# Patient Record
Sex: Female | Born: 1955 | Race: Black or African American | Hispanic: No | Marital: Married | Smoking: Never smoker
Health system: Southern US, Community
[De-identification: ages and names within clinical notes are randomized; demographics above are authoritative.]

## PROBLEM LIST (undated history)

## (undated) DIAGNOSIS — I10 Essential (primary) hypertension: Secondary | ICD-10-CM

## (undated) DIAGNOSIS — K5792 Diverticulitis of intestine, part unspecified, without perforation or abscess without bleeding: Secondary | ICD-10-CM

## (undated) DIAGNOSIS — J302 Other seasonal allergic rhinitis: Secondary | ICD-10-CM

## (undated) HISTORY — PX: LAPAROSCOPIC HYSTERECTOMY: SHX1926

## (undated) HISTORY — PX: ABDOMINAL HYSTERECTOMY: SHX81

## (undated) HISTORY — PX: CHOLECYSTECTOMY: SHX55

---

## 2003-08-27 ENCOUNTER — Other Ambulatory Visit: Admission: RE | Admit: 2003-08-27 | Discharge: 2003-08-27 | Payer: Self-pay | Admitting: Obstetrics and Gynecology

## 2004-07-16 ENCOUNTER — Other Ambulatory Visit: Admission: RE | Admit: 2004-07-16 | Discharge: 2004-07-16 | Payer: Self-pay | Admitting: Obstetrics and Gynecology

## 2004-11-24 ENCOUNTER — Other Ambulatory Visit: Admission: RE | Admit: 2004-11-24 | Discharge: 2004-11-24 | Payer: Self-pay | Admitting: Obstetrics and Gynecology

## 2005-12-06 ENCOUNTER — Other Ambulatory Visit: Admission: RE | Admit: 2005-12-06 | Discharge: 2005-12-06 | Payer: Self-pay | Admitting: Obstetrics and Gynecology

## 2013-07-28 ENCOUNTER — Encounter (HOSPITAL_BASED_OUTPATIENT_CLINIC_OR_DEPARTMENT_OTHER): Payer: Self-pay | Admitting: Emergency Medicine

## 2013-07-28 ENCOUNTER — Emergency Department (HOSPITAL_BASED_OUTPATIENT_CLINIC_OR_DEPARTMENT_OTHER)
Admission: EM | Admit: 2013-07-28 | Discharge: 2013-07-29 | Disposition: A | Discharge status: Home / Self Care | Payer: BC Managed Care – PPO | Attending: Emergency Medicine | Admitting: Emergency Medicine

## 2013-07-28 ENCOUNTER — Emergency Department (HOSPITAL_BASED_OUTPATIENT_CLINIC_OR_DEPARTMENT_OTHER): Payer: BC Managed Care – PPO

## 2013-07-28 DIAGNOSIS — K5792 Diverticulitis of intestine, part unspecified, without perforation or abscess without bleeding: Secondary | ICD-10-CM

## 2013-07-28 DIAGNOSIS — K5732 Diverticulitis of large intestine without perforation or abscess without bleeding: Secondary | ICD-10-CM | POA: Insufficient documentation

## 2013-07-28 DIAGNOSIS — I10 Essential (primary) hypertension: Secondary | ICD-10-CM | POA: Insufficient documentation

## 2013-07-28 HISTORY — DX: Essential (primary) hypertension: I10

## 2013-07-28 LAB — CBC WITH DIFFERENTIAL/PLATELET
BASOS ABS: 0 10*3/uL (ref 0.0–0.1)
Basophils Relative: 0 % (ref 0–1)
EOS PCT: 0 % (ref 0–5)
Eosinophils Absolute: 0 10*3/uL (ref 0.0–0.7)
HCT: 40.1 % (ref 36.0–46.0)
Hemoglobin: 13 g/dL (ref 12.0–15.0)
LYMPHS PCT: 23 % (ref 12–46)
Lymphs Abs: 1.8 10*3/uL (ref 0.7–4.0)
MCH: 27 pg (ref 26.0–34.0)
MCHC: 32.4 g/dL (ref 30.0–36.0)
MCV: 83.4 fL (ref 78.0–100.0)
Monocytes Absolute: 0.5 10*3/uL (ref 0.1–1.0)
Monocytes Relative: 6 % (ref 3–12)
NEUTROS PCT: 70 % (ref 43–77)
Neutro Abs: 5.4 10*3/uL (ref 1.7–7.7)
PLATELETS: 310 10*3/uL (ref 150–400)
RBC: 4.81 MIL/uL (ref 3.87–5.11)
RDW: 15.2 % (ref 11.5–15.5)
WBC: 7.6 10*3/uL (ref 4.0–10.5)

## 2013-07-28 LAB — URINALYSIS, ROUTINE W REFLEX MICROSCOPIC
Bilirubin Urine: NEGATIVE
Glucose, UA: NEGATIVE mg/dL
Hgb urine dipstick: NEGATIVE
Ketones, ur: NEGATIVE mg/dL
LEUKOCYTES UA: NEGATIVE
NITRITE: NEGATIVE
PROTEIN: NEGATIVE mg/dL
Specific Gravity, Urine: 1.008 (ref 1.005–1.030)
UROBILINOGEN UA: 1 mg/dL (ref 0.0–1.0)
pH: 8.5 — ABNORMAL HIGH (ref 5.0–8.0)

## 2013-07-28 LAB — COMPREHENSIVE METABOLIC PANEL
ALK PHOS: 71 U/L (ref 39–117)
ALT: 11 U/L (ref 0–35)
AST: 17 U/L (ref 0–37)
Albumin: 3.9 g/dL (ref 3.5–5.2)
BUN: 10 mg/dL (ref 6–23)
CO2: 26 meq/L (ref 19–32)
Calcium: 9.6 mg/dL (ref 8.4–10.5)
Chloride: 103 mEq/L (ref 96–112)
Creatinine, Ser: 0.8 mg/dL (ref 0.50–1.10)
GFR, EST NON AFRICAN AMERICAN: 80 mL/min — AB (ref >90)
GLUCOSE: 121 mg/dL — AB (ref 70–99)
Potassium: 3.4 mEq/L — ABNORMAL LOW (ref 3.7–5.3)
Sodium: 144 mEq/L (ref 137–147)
TOTAL PROTEIN: 7.6 g/dL (ref 6.0–8.3)
Total Bilirubin: 0.6 mg/dL (ref 0.3–1.2)

## 2013-07-28 LAB — LIPASE, BLOOD: LIPASE: 15 U/L (ref 11–59)

## 2013-07-28 MED ORDER — IOHEXOL 300 MG/ML  SOLN
100.0000 mL | Freq: Once | INTRAMUSCULAR | Status: AC | PRN
  Administered 2013-07-28: 100 mL via INTRAVENOUS

## 2013-07-28 MED ORDER — ONDANSETRON HCL 4 MG/2ML IJ SOLN
4.0000 mg | Freq: Once | INTRAMUSCULAR | Status: AC
  Administered 2013-07-28: 4 mg via INTRAMUSCULAR
  Filled 2013-07-28: qty 2

## 2013-07-28 MED ORDER — IOHEXOL 300 MG/ML  SOLN
50.0000 mL | Freq: Once | INTRAMUSCULAR | Status: AC | PRN
  Administered 2013-07-28: 50 mL via ORAL

## 2013-07-28 NOTE — ED Notes (Signed)
Patient here with ongoing abdominal cramping with loose stools and vomiting after any food intake. Reports that this all started after norovirus in November. Has taken antibiotics for same with no relief

## 2013-07-28 NOTE — ED Provider Notes (Signed)
CSN: [id number]     Arrival date & time 07/28/13  1800 History   None    Chief Complaint  Patient presents with  . Abdominal Pain  . Emesis  . Diarrhea     (Consider location/radiation/quality/duration/timing/severity/associated sxs/prior Treatment) Patient is a 58 y.o. female presenting with abdominal pain. The history is provided by the patient. No language interpreter was used.  Abdominal Pain Pain location:  Generalized Pain quality: aching   Pain radiates to:  Does not radiate Pain severity:  Moderate Onset quality:  Gradual Duration: november. Timing:  Constant Relieved by:  Nothing Associated symptoms: diarrhea, fever and vomiting     Past Medical History  Diagnosis Date  . Hypertension    History reviewed. No pertinent past surgical history. No family history on file. History  Substance Use Topics  . Smoking status: Never Smoker   . Smokeless tobacco: Not on file  . Alcohol Use: Not on file   OB History   Grav Para Term Preterm Abortions TAB SAB Ect Mult Living                 Review of Systems  Constitutional: Positive for fever.  Gastrointestinal: Positive for vomiting, abdominal pain and diarrhea.  All other systems reviewed and are negative.      Allergies  Review of patient's allergies indicates no known allergies.  Home Medications   Current Outpatient Rx  Name  Route  Sig  Dispense  Refill  . diltiazem (CARDIZEM) 120 MG tablet   Oral   Take 120 mg by mouth once.          BP 113/47  Pulse 91  Temp(Src) 98.6 F (37 C) (Oral)  Resp 18  Wt 229 lb (103.874 kg)  SpO2 100% Physical Exam  Nursing note and vitals reviewed. Constitutional: She is oriented to person, place, and time. She appears well-developed and well-nourished.  HENT:  Head: Normocephalic.  Right Ear: External ear normal.  Left Ear: External ear normal.  Mouth/Throat: Oropharynx is clear and moist.  Eyes: Conjunctivae and EOM are normal. Pupils are equal, round,  and reactive to light.  Neck: Normal range of motion.  Pulmonary/Chest: Effort normal.  Abdominal: Soft. She exhibits no distension. There is tenderness.  Musculoskeletal: Normal range of motion.  Neurological: She is alert and oriented to person, place, and time.  Skin: Skin is warm.  Psychiatric: She has a normal mood and affect.    ED Course  Procedures (including critical care time) Labs Review Labs Reviewed  URINALYSIS, ROUTINE W REFLEX MICROSCOPIC - Abnormal; Notable for the following:    pH 8.5 (*)    All other components within normal limits  COMPREHENSIVE METABOLIC PANEL - Abnormal; Notable for the following:    Potassium 3.4 (*)    Glucose, Bld 121 (*)    GFR calc non Af Amer 80 (*)    All other components within normal limits  CBC WITH DIFFERENTIAL  LIPASE, BLOOD   Imaging Review No results found.   EKG Interpretation None      MDM Ct scan shows diverticulitis.    Pt started on cipro, flagyl, hydoccodone, and zofran.   [facility] for [facility].   Pt advised to see her primary for recheck this week   Final diagnoses:  Diverticulitis        Elson AreasLeslie K Sofia, PA-C 07/29/13 0015

## 2013-07-29 MED ORDER — METRONIDAZOLE 500 MG PO TABS: 500.0000 mg | ORAL_TABLET | Freq: Two times a day (BID) | ORAL | Status: DC

## 2013-07-29 MED ORDER — ONDANSETRON 4 MG PO TBDP: 4.0000 mg | ORAL_TABLET | Freq: Three times a day (TID) | ORAL | Status: DC | PRN

## 2013-07-29 MED ORDER — HYDROCODONE-ACETAMINOPHEN 5-325 MG PO TABS: 2.0000 | ORAL_TABLET | ORAL | Status: DC | PRN

## 2013-07-29 MED ORDER — ONDANSETRON HCL 4 MG/2ML IJ SOLN
4.0000 mg | Freq: Once | INTRAMUSCULAR | Status: AC
  Administered 2013-07-29: 4 mg via INTRAMUSCULAR
  Filled 2013-07-29: qty 2

## 2013-07-29 MED ORDER — CIPROFLOXACIN HCL 500 MG PO TABS: 500.0000 mg | ORAL_TABLET | Freq: Two times a day (BID) | ORAL | Status: DC

## 2013-07-29 MED ORDER — HYDROMORPHONE HCL PF 1 MG/ML IJ SOLN
1.0000 mg | Freq: Once | INTRAMUSCULAR | Status: AC
  Administered 2013-07-29: 1 mg via INTRAVENOUS
  Filled 2013-07-29: qty 1

## 2013-07-29 NOTE — ED Provider Notes (Signed)
Medical screening examination/treatment/procedure(s) were performed by non-physician practitioner and as supervising physician I was immediately available for consultation/collaboration.   EKG Interpretation None        Megan E Docherty, MD 07/29/13 1501 

## 2013-07-29 NOTE — ED Notes (Signed)
Pt c/o increasing abdominal pain prior to discharge.  Medications given.  Patient is verbalizing some relief.

## 2013-07-29 NOTE — Discharge Instructions (Signed)
Diverticulitis °A diverticulum is a small pouch or sac on the colon. Diverticulosis is the presence of these diverticula on the colon. Diverticulitis is the irritation (inflammation) or infection of diverticula. °CAUSES  °The colon and its diverticula contain bacteria. If food particles block the tiny opening to a diverticulum, the bacteria inside can grow and cause an increase in pressure. This leads to infection and inflammation and is called diverticulitis. °SYMPTOMS  °· Abdominal pain and tenderness. Usually, the pain is located on the left side of your abdomen. However, it could be located elsewhere. °· Fever. °· Bloating. °· Feeling sick to your stomach (nausea). °· Throwing up (vomiting). °· Abnormal stools. °DIAGNOSIS  °Your caregiver will take a history and perform a physical exam. Since many things can cause abdominal pain, other tests may be necessary. Tests may include: °· Blood tests. °· Urine tests. °· X-ray of the abdomen. °· CT scan of the abdomen. °Sometimes, surgery is needed to determine if diverticulitis or other conditions are causing your symptoms. °TREATMENT  °Most of the time, you can be treated without surgery. Treatment includes: °· Resting the bowels by only having liquids for a few days. As you improve, you will need to eat a low-fiber diet. °· Intravenous (IV) fluids if you are losing body fluids (dehydrated). °· Antibiotic medicines that treat infections may be given. °· Pain and nausea medicine, if needed. °· Surgery if the inflamed diverticulum has burst. °HOME CARE INSTRUCTIONS  °· Try a clear liquid diet (broth, tea, or water for as long as directed by your caregiver). You may then gradually begin a low-fiber diet as tolerated.  °A low-fiber diet is a diet with less than 10 grams of fiber. Choose the foods below to reduce fiber in the diet: °· White breads, cereals, rice, and pasta. °· Cooked fruits and vegetables or soft fresh fruits and vegetables without the skin. °· Ground or  well-cooked tender beef, ham, veal, lamb, pork, or poultry. °· Eggs and seafood. °· After your diverticulitis symptoms have improved, your caregiver may put you on a high-fiber diet. A high-fiber diet includes 14 grams of fiber for every 1000 calories consumed. For a standard 2000 calorie diet, you would need 28 grams of fiber. Follow these diet guidelines to help you increase the fiber in your diet. It is important to slowly increase the amount fiber in your diet to avoid gas, constipation, and bloating. °· Choose whole-grain breads, cereals, pasta, and brown rice. °· Choose fresh fruits and vegetables with the skin on. Do not overcook vegetables because the more vegetables are cooked, the more fiber is lost. °· Choose more nuts, seeds, legumes, dried peas, beans, and lentils. °· Look for food products that have greater than 3 grams of fiber per serving on the Nutrition Facts label. °· Take all medicine as directed by your caregiver. °· If your caregiver has given you a follow-up appointment, it is very important that you go. Not going could result in lasting (chronic) or permanent injury, pain, and disability. If there is any problem keeping the appointment, call to reschedule. °SEEK MEDICAL CARE IF:  °· Your pain does not improve. °· You have a hard time advancing your diet beyond clear liquids. °· Your bowel movements do not return to normal. °SEEK IMMEDIATE MEDICAL CARE IF:  °· Your pain becomes worse. °· You have an oral temperature above 102° F (38.9° C), not controlled by medicine. °· You have repeated vomiting. °· You have bloody or black, tarry stools. °·   Symptoms that brought you to your caregiver become worse or are not getting better. °MAKE SURE YOU:  °· Understand these instructions. °· Will watch your condition. °· Will get help right away if you are not doing well or get worse. °Document Released: 02/02/2005 Document Revised: 07/18/2011 Document Reviewed: 05/31/2010 °[employer]® Patient Information  ©2014 [employer]. ° °

## 2013-07-29 NOTE — ED Notes (Signed)
Pt was assisted to her vehicle.  Verbalizes feeling much better.  Ambulatory and stable for discharge.

## 2013-09-17 ENCOUNTER — Encounter (HOSPITAL_BASED_OUTPATIENT_CLINIC_OR_DEPARTMENT_OTHER): Payer: Self-pay | Admitting: Emergency Medicine

## 2013-09-17 ENCOUNTER — Emergency Department (HOSPITAL_BASED_OUTPATIENT_CLINIC_OR_DEPARTMENT_OTHER)
Admission: EM | Admit: 2013-09-17 | Discharge: 2013-09-17 | Disposition: A | Discharge status: Home / Self Care | Payer: BC Managed Care – PPO | Attending: Emergency Medicine | Admitting: Emergency Medicine

## 2013-09-17 ENCOUNTER — Emergency Department (HOSPITAL_BASED_OUTPATIENT_CLINIC_OR_DEPARTMENT_OTHER): Payer: BC Managed Care – PPO

## 2013-09-17 DIAGNOSIS — Z9089 Acquired absence of other organs: Secondary | ICD-10-CM | POA: Insufficient documentation

## 2013-09-17 DIAGNOSIS — Z792 Long term (current) use of antibiotics: Secondary | ICD-10-CM | POA: Insufficient documentation

## 2013-09-17 DIAGNOSIS — I10 Essential (primary) hypertension: Secondary | ICD-10-CM | POA: Insufficient documentation

## 2013-09-17 DIAGNOSIS — K5732 Diverticulitis of large intestine without perforation or abscess without bleeding: Secondary | ICD-10-CM | POA: Insufficient documentation

## 2013-09-17 DIAGNOSIS — K5792 Diverticulitis of intestine, part unspecified, without perforation or abscess without bleeding: Secondary | ICD-10-CM

## 2013-09-17 HISTORY — DX: Diverticulitis of intestine, part unspecified, without perforation or abscess without bleeding: K57.92

## 2013-09-17 LAB — CBC WITH DIFFERENTIAL/PLATELET
Basophils Absolute: 0 10*3/uL (ref 0.0–0.1)
Basophils Relative: 0 % (ref 0–1)
EOS ABS: 0.1 10*3/uL (ref 0.0–0.7)
Eosinophils Relative: 1 % (ref 0–5)
HCT: 40.3 % (ref 36.0–46.0)
HEMOGLOBIN: 13.3 g/dL (ref 12.0–15.0)
Lymphocytes Relative: 30 % (ref 12–46)
Lymphs Abs: 1.6 10*3/uL (ref 0.7–4.0)
MCH: 27.8 pg (ref 26.0–34.0)
MCHC: 33 g/dL (ref 30.0–36.0)
MCV: 84.3 fL (ref 78.0–100.0)
MONOS PCT: 7 % (ref 3–12)
Monocytes Absolute: 0.4 10*3/uL (ref 0.1–1.0)
NEUTROS PCT: 62 % (ref 43–77)
Neutro Abs: 3.1 10*3/uL (ref 1.7–7.7)
Platelets: 285 10*3/uL (ref 150–400)
RBC: 4.78 MIL/uL (ref 3.87–5.11)
RDW: 14.6 % (ref 11.5–15.5)
WBC: 5.1 10*3/uL (ref 4.0–10.5)

## 2013-09-17 LAB — URINALYSIS, ROUTINE W REFLEX MICROSCOPIC
Bilirubin Urine: NEGATIVE
Glucose, UA: NEGATIVE mg/dL
Hgb urine dipstick: NEGATIVE
KETONES UR: NEGATIVE mg/dL
Leukocytes, UA: NEGATIVE
NITRITE: NEGATIVE
PH: 6 (ref 5.0–8.0)
Protein, ur: NEGATIVE mg/dL
Specific Gravity, Urine: 1.004 — ABNORMAL LOW (ref 1.005–1.030)
Urobilinogen, UA: 0.2 mg/dL (ref 0.0–1.0)

## 2013-09-17 LAB — COMPREHENSIVE METABOLIC PANEL
ALBUMIN: 3.9 g/dL (ref 3.5–5.2)
ALK PHOS: 60 U/L (ref 39–117)
ALT: 13 U/L (ref 0–35)
AST: 22 U/L (ref 0–37)
BUN: 6 mg/dL (ref 6–23)
CHLORIDE: 106 meq/L (ref 96–112)
CO2: 24 mEq/L (ref 19–32)
Calcium: 9.8 mg/dL (ref 8.4–10.5)
Creatinine, Ser: 0.7 mg/dL (ref 0.50–1.10)
GFR calc Af Amer: >90 mL/min (ref >90)
GFR calc non Af Amer: >90 mL/min (ref >90)
Glucose, Bld: 103 mg/dL — ABNORMAL HIGH (ref 70–99)
POTASSIUM: 3.2 meq/L — AB (ref 3.7–5.3)
Sodium: 144 mEq/L (ref 137–147)
TOTAL PROTEIN: 7.4 g/dL (ref 6.0–8.3)
Total Bilirubin: 0.4 mg/dL (ref 0.3–1.2)

## 2013-09-17 MED ORDER — HYDROMORPHONE HCL PF 1 MG/ML IJ SOLN
1.0000 mg | Freq: Once | INTRAMUSCULAR | Status: AC
  Administered 2013-09-17: 1 mg via INTRAVENOUS
  Filled 2013-09-17: qty 1

## 2013-09-17 MED ORDER — IOHEXOL 300 MG/ML  SOLN
50.0000 mL | Freq: Once | INTRAMUSCULAR | Status: AC | PRN
  Administered 2013-09-17: 50 mL via ORAL

## 2013-09-17 MED ORDER — METRONIDAZOLE 500 MG PO TABS: 500.0000 mg | ORAL_TABLET | Freq: Two times a day (BID) | ORAL | Status: DC

## 2013-09-17 MED ORDER — IOHEXOL 300 MG/ML  SOLN
100.0000 mL | Freq: Once | INTRAMUSCULAR | Status: AC | PRN
  Administered 2013-09-17: 100 mL via INTRAVENOUS

## 2013-09-17 MED ORDER — CIPROFLOXACIN HCL 500 MG PO TABS: 500.0000 mg | ORAL_TABLET | Freq: Two times a day (BID) | ORAL | Status: DC

## 2013-09-17 MED ORDER — ONDANSETRON 4 MG PO TBDP: 4.0000 mg | ORAL_TABLET | Freq: Three times a day (TID) | ORAL | Status: DC | PRN

## 2013-09-17 MED ORDER — ONDANSETRON HCL 4 MG/2ML IJ SOLN
4.0000 mg | Freq: Once | INTRAMUSCULAR | Status: AC
Start: 2013-09-17 — End: 2013-09-17
  Administered 2013-09-17: 4 mg via INTRAVENOUS
  Filled 2013-09-17: qty 2

## 2013-09-17 MED ORDER — SODIUM CHLORIDE 0.9 % IV SOLN
Freq: Once | INTRAVENOUS | Status: AC
  Administered 2013-09-17: 1000 mL via INTRAVENOUS

## 2013-09-17 MED ORDER — HYDROCODONE-ACETAMINOPHEN 5-325 MG PO TABS: 2.0000 | ORAL_TABLET | ORAL | Status: DC | PRN

## 2013-09-17 NOTE — ED Provider Notes (Signed)
CSN: [id number]     Arrival date & time 09/17/13  1328 History   First MD Initiated Contact with Patient 09/17/13 1334     No chief complaint on file.    (Consider location/radiation/quality/duration/timing/severity/associated sxs/prior Treatment) Patient is a 58 y.o. female presenting with abdominal pain. The history is provided by the patient. No language interpreter was used.  Abdominal Pain Pain location:  Generalized Pain radiates to:  LLQ and RLQ Pain severity:  Moderate Onset quality:  Sudden Timing:  Constant Progression:  Worsening Chronicity:  New Relieved by:  Nothing Worsened by:  Nothing tried Ineffective treatments:  None tried Associated symptoms: no sore throat    Pt sent here for evaluation [facility].   Pt has diverticulitis.   Pt has increased pain.   Pt advised to come here for a ct scan to make sure she has not ruptured. Past Medical History  Diagnosis Date  . Hypertension    Past Surgical History  Procedure Laterality Date  . Laparoscopic hysterectomy    . Cholecystectomy     No family history on file. History  Substance Use Topics  . Smoking status: Never Smoker   . Smokeless tobacco: Not on file  . Alcohol Use: Not on file   OB History   Grav Para Term Preterm Abortions TAB SAB Ect Mult Living                 Review of Systems  HENT: Negative for sore throat.   Gastrointestinal: Positive for abdominal pain.  All other systems reviewed and are negative.     Allergies  Review of patient's allergies indicates no known allergies.  Home Medications   Prior to Admission medications   Medication Sig Start Date End Date Taking? Authorizing Provider  ciprofloxacin (CIPRO) 500 MG tablet Take 1 tablet (500 mg total) by mouth 2 (two) times daily. 07/29/13   Elson AreasLeslie K Sofia, PA-C  diltiazem (CARDIZEM) 120 MG tablet Take 120 mg by mouth once.    Historical Provider, MD  HYDROcodone-acetaminophen (NORCO/VICODIN) 5-325 MG per tablet Take 2  tablets by mouth every 4 (four) hours as needed. 07/29/13   Elson AreasLeslie K Sofia, PA-C  metroNIDAZOLE (FLAGYL) 500 MG tablet Take 1 tablet (500 mg total) by mouth 2 (two) times daily. 07/29/13   Elson AreasLeslie K Sofia, PA-C  ondansetron (ZOFRAN ODT) 4 MG disintegrating tablet Take 1 tablet (4 mg total) by mouth every 8 (eight) hours as needed for nausea or vomiting. 07/29/13   Elson AreasLeslie K Sofia, PA-C   BP 125/90  Pulse 97  Temp(Src) 98.7 F (37.1 C) (Oral)  Resp 18  Ht 5\' 2"  (1.575 m)  Wt 222 lb (100.699 kg)  BMI 40.59 kg/m2  SpO2 99% Physical Exam  Vitals reviewed. Constitutional: She is oriented to person, place, and time. She appears well-developed and well-nourished.  HENT:  Head: Normocephalic.  Right Ear: External ear normal.  Left Ear: External ear normal.  Eyes: Conjunctivae and EOM are normal. Pupils are equal, round, and reactive to light.  Neck: Normal range of motion. Neck supple.  Cardiovascular: Normal rate and normal heart sounds.   Pulmonary/Chest: Effort normal and breath sounds normal.  Abdominal: Soft. There is tenderness.  Musculoskeletal: Normal range of motion.  Neurological: She is alert and oriented to person, place, and time. She has normal reflexes.  Skin: Skin is warm.  Psychiatric: She has a normal mood and affect.    ED Course  Procedures (including critical care time) Labs Review  Labs Reviewed  URINALYSIS, ROUTINE W REFLEX MICROSCOPIC    Imaging Review No results found.   EKG Interpretation None      MDM   Final diagnoses:  Diverticulitis    Ct acute diverticulitis.  No rupture.   Pt advised to follow up with Gi.   Rx for cipro, flagyl, hydrocodone and zofran    Elson AreasLeslie K Sofia, PA-C 09/17/13 1731

## 2013-09-17 NOTE — ED Notes (Addendum)
Patient c/o generalized abd pain that comes in waves, currently taking antibiotics for diverticulitis that she restarted on Friday. Had a colonoscopy on May 7. Several soft bowel movements today

## 2013-09-17 NOTE — Discharge Instructions (Signed)
Diverticulitis °A diverticulum is a small pouch or sac on the colon. Diverticulosis is the presence of these diverticula on the colon. Diverticulitis is the irritation (inflammation) or infection of diverticula. °CAUSES  °The colon and its diverticula contain bacteria. If food particles block the tiny opening to a diverticulum, the bacteria inside can grow and cause an increase in pressure. This leads to infection and inflammation and is called diverticulitis. °SYMPTOMS  °· Abdominal pain and tenderness. Usually, the pain is located on the left side of your abdomen. However, it could be located elsewhere. °· Fever. °· Bloating. °· Feeling sick to your stomach (nausea). °· Throwing up (vomiting). °· Abnormal stools. °DIAGNOSIS  °Your caregiver will take a history and perform a physical exam. Since many things can cause abdominal pain, other tests may be necessary. Tests may include: °· Blood tests. °· Urine tests. °· X-ray of the abdomen. °· CT scan of the abdomen. °Sometimes, surgery is needed to determine if diverticulitis or other conditions are causing your symptoms. °TREATMENT  °Most of the time, you can be treated without surgery. Treatment includes: °· Resting the bowels by only having liquids for a few days. As you improve, you will need to eat a low-fiber diet. °· Intravenous (IV) fluids if you are losing body fluids (dehydrated). °· Antibiotic medicines that treat infections may be given. °· Pain and nausea medicine, if needed. °· Surgery if the inflamed diverticulum has burst. °HOME CARE INSTRUCTIONS  °· Try a clear liquid diet (broth, tea, or water for as long as directed by your caregiver). You may then gradually begin a low-fiber diet as tolerated.  °A low-fiber diet is a diet with less than 10 grams of fiber. Choose the foods below to reduce fiber in the diet: °· White breads, cereals, rice, and pasta. °· Cooked fruits and vegetables or soft fresh fruits and vegetables without the skin. °· Ground or  well-cooked tender beef, ham, veal, lamb, pork, or poultry. °· Eggs and seafood. °· After your diverticulitis symptoms have improved, your caregiver may put you on a high-fiber diet. A high-fiber diet includes 14 grams of fiber for every 1000 calories consumed. For a standard 2000 calorie diet, you would need 28 grams of fiber. Follow these diet guidelines to help you increase the fiber in your diet. It is important to slowly increase the amount fiber in your diet to avoid gas, constipation, and bloating. °· Choose whole-grain breads, cereals, pasta, and brown rice. °· Choose fresh fruits and vegetables with the skin on. Do not overcook vegetables because the more vegetables are cooked, the more fiber is lost. °· Choose more nuts, seeds, legumes, dried peas, beans, and lentils. °· Look for food products that have greater than 3 grams of fiber per serving on the Nutrition Facts label. °· Take all medicine as directed by your caregiver. °· If your caregiver has given you a follow-up appointment, it is very important that you go. Not going could result in lasting (chronic) or permanent injury, pain, and disability. If there is any problem keeping the appointment, call to reschedule. °SEEK MEDICAL CARE IF:  °· Your pain does not improve. °· You have a hard time advancing your diet beyond clear liquids. °· Your bowel movements do not return to normal. °SEEK IMMEDIATE MEDICAL CARE IF:  °· Your pain becomes worse. °· You have an oral temperature above 102° F (38.9° C), not controlled by medicine. °· You have repeated vomiting. °· You have bloody or black, tarry stools. °·   Symptoms that brought you to your caregiver become worse or are not getting better. °MAKE SURE YOU:  °· Understand these instructions. °· Will watch your condition. °· Will get help right away if you are not doing well or get worse. °Document Released: 02/02/2005 Document Revised: 07/18/2011 Document Reviewed: 05/31/2010 °[employer]® Patient Information  ©2014 [employer]. ° °

## 2013-09-17 NOTE — ED Provider Notes (Signed)
Medical screening examination/treatment/procedure(s) were performed by non-physician practitioner and as supervising physician I was immediately available for consultation/collaboration.     Celene KrasJon R Meryem Haertel, MD 09/17/13 [phone]

## 2013-09-20 ENCOUNTER — Encounter (HOSPITAL_COMMUNITY): Payer: Self-pay | Admitting: Emergency Medicine

## 2013-09-20 ENCOUNTER — Inpatient Hospital Stay (HOSPITAL_COMMUNITY)
Admission: EM | Admit: 2013-09-20 | Discharge: 2013-09-24 | DRG: 392 | Disposition: A | Discharge status: Home / Self Care | Payer: BC Managed Care – PPO | Attending: Internal Medicine | Admitting: Internal Medicine

## 2013-09-20 DIAGNOSIS — I10 Essential (primary) hypertension: Secondary | ICD-10-CM | POA: Diagnosis present

## 2013-09-20 DIAGNOSIS — K529 Noninfective gastroenteritis and colitis, unspecified: Secondary | ICD-10-CM | POA: Diagnosis present

## 2013-09-20 DIAGNOSIS — R197 Diarrhea, unspecified: Secondary | ICD-10-CM | POA: Diagnosis present

## 2013-09-20 DIAGNOSIS — K5732 Diverticulitis of large intestine without perforation or abscess without bleeding: Principal | ICD-10-CM | POA: Diagnosis present

## 2013-09-20 DIAGNOSIS — Z6841 Body Mass Index (BMI) 40.0 and over, adult: Secondary | ICD-10-CM

## 2013-09-20 DIAGNOSIS — K5792 Diverticulitis of intestine, part unspecified, without perforation or abscess without bleeding: Secondary | ICD-10-CM | POA: Diagnosis present

## 2013-09-20 DIAGNOSIS — Z79899 Other long term (current) drug therapy: Secondary | ICD-10-CM

## 2013-09-20 DIAGNOSIS — Z9089 Acquired absence of other organs: Secondary | ICD-10-CM

## 2013-09-20 DIAGNOSIS — T3695XA Adverse effect of unspecified systemic antibiotic, initial encounter: Secondary | ICD-10-CM | POA: Diagnosis present

## 2013-09-20 LAB — URINALYSIS, ROUTINE W REFLEX MICROSCOPIC
Bilirubin Urine: NEGATIVE
GLUCOSE, UA: NEGATIVE mg/dL
Hgb urine dipstick: NEGATIVE
KETONES UR: NEGATIVE mg/dL
Nitrite: NEGATIVE
PROTEIN: NEGATIVE mg/dL
Specific Gravity, Urine: 1.018 (ref 1.005–1.030)
UROBILINOGEN UA: 0.2 mg/dL (ref 0.0–1.0)
pH: 5.5 (ref 5.0–8.0)

## 2013-09-20 LAB — BASIC METABOLIC PANEL
BUN: 7 mg/dL (ref 6–23)
CALCIUM: 9.6 mg/dL (ref 8.4–10.5)
CO2: 27 mEq/L (ref 19–32)
CREATININE: 0.72 mg/dL (ref 0.50–1.10)
Chloride: 103 mEq/L (ref 96–112)
GFR calc Af Amer: >90 mL/min (ref >90)
GFR calc non Af Amer: >90 mL/min (ref >90)
GLUCOSE: 174 mg/dL — AB (ref 70–99)
Potassium: 3.3 mEq/L — ABNORMAL LOW (ref 3.7–5.3)
Sodium: 142 mEq/L (ref 137–147)

## 2013-09-20 LAB — URINE MICROSCOPIC-ADD ON

## 2013-09-20 LAB — CBC WITH DIFFERENTIAL/PLATELET
Basophils Absolute: 0 10*3/uL (ref 0.0–0.1)
Basophils Relative: 0 % (ref 0–1)
EOS ABS: 0.1 10*3/uL (ref 0.0–0.7)
EOS PCT: 2 % (ref 0–5)
HEMATOCRIT: 40 % (ref 36.0–46.0)
Hemoglobin: 13.3 g/dL (ref 12.0–15.0)
LYMPHS ABS: 1.8 10*3/uL (ref 0.7–4.0)
Lymphocytes Relative: 38 % (ref 12–46)
MCH: 27.4 pg (ref 26.0–34.0)
MCHC: 33.3 g/dL (ref 30.0–36.0)
MCV: 82.5 fL (ref 78.0–100.0)
MONO ABS: 0.3 10*3/uL (ref 0.1–1.0)
Monocytes Relative: 5 % (ref 3–12)
Neutro Abs: 2.5 10*3/uL (ref 1.7–7.7)
Neutrophils Relative %: 54 % (ref 43–77)
Platelets: 293 10*3/uL (ref 150–400)
RBC: 4.85 MIL/uL (ref 3.87–5.11)
RDW: 14.6 % (ref 11.5–15.5)
WBC: 4.6 10*3/uL (ref 4.0–10.5)

## 2013-09-20 MED ORDER — CIPROFLOXACIN IN D5W 400 MG/200ML IV SOLN: 400.0000 mg | Freq: Once | INTRAVENOUS | Status: DC

## 2013-09-20 MED ORDER — POTASSIUM CHLORIDE 10 MEQ/100ML IV SOLN
10.0000 meq | INTRAVENOUS | Status: AC
  Administered 2013-09-20 – 2013-09-21 (×4): 10 meq via INTRAVENOUS
  Filled 2013-09-20: qty 100

## 2013-09-20 MED ORDER — ACETAMINOPHEN 650 MG RE SUPP: 650.0000 mg | Freq: Four times a day (QID) | RECTAL | Status: DC | PRN

## 2013-09-20 MED ORDER — HEPARIN SODIUM (PORCINE) 5000 UNIT/ML IJ SOLN
5000.0000 [IU] | Freq: Three times a day (TID) | INTRAMUSCULAR | Status: DC
  Administered 2013-09-20 – 2013-09-23 (×9): 5000 [IU] via SUBCUTANEOUS
  Filled 2013-09-20 (×16): qty 1

## 2013-09-20 MED ORDER — HYDROMORPHONE HCL PF 1 MG/ML IJ SOLN: 1.0000 mg | INTRAMUSCULAR | Status: DC | PRN

## 2013-09-20 MED ORDER — ONDANSETRON HCL 4 MG/2ML IJ SOLN
4.0000 mg | Freq: Four times a day (QID) | INTRAMUSCULAR | Status: DC | PRN
  Administered 2013-09-20 – 2013-09-23 (×2): 4 mg via INTRAVENOUS
  Filled 2013-09-20: qty 2

## 2013-09-20 MED ORDER — METRONIDAZOLE IN NACL 5-0.79 MG/ML-% IV SOLN
500.0000 mg | Freq: Once | INTRAVENOUS | Status: DC
Start: 2013-09-20 — End: 2013-09-20

## 2013-09-20 MED ORDER — SODIUM CHLORIDE 0.9 % IV BOLUS (SEPSIS)
1000.0000 mL | INTRAVENOUS | Status: AC
  Administered 2013-09-20: 1000 mL via INTRAVENOUS

## 2013-09-20 MED ORDER — MORPHINE SULFATE 2 MG/ML IJ SOLN
2.0000 mg | INTRAMUSCULAR | Status: DC | PRN
  Administered 2013-09-21: 2 mg via INTRAVENOUS
  Filled 2013-09-20: qty 1

## 2013-09-20 MED ORDER — METRONIDAZOLE IN NACL 5-0.79 MG/ML-% IV SOLN
500.0000 mg | Freq: Three times a day (TID) | INTRAVENOUS | Status: DC
  Administered 2013-09-20 – 2013-09-23 (×9): 500 mg via INTRAVENOUS
  Filled 2013-09-20 (×12): qty 100

## 2013-09-20 MED ORDER — CIPROFLOXACIN IN D5W 400 MG/200ML IV SOLN
400.0000 mg | Freq: Two times a day (BID) | INTRAVENOUS | Status: DC
  Administered 2013-09-20 – 2013-09-23 (×6): 400 mg via INTRAVENOUS
  Filled 2013-09-20 (×7): qty 200

## 2013-09-20 MED ORDER — MORPHINE SULFATE 4 MG/ML IJ SOLN
4.0000 mg | Freq: Once | INTRAMUSCULAR | Status: AC
  Administered 2013-09-20: 4 mg via INTRAVENOUS
  Filled 2013-09-20: qty 1

## 2013-09-20 MED ORDER — SODIUM CHLORIDE 0.9 % IV SOLN
INTRAVENOUS | Status: DC
  Administered 2013-09-20 – 2013-09-22 (×2): via INTRAVENOUS

## 2013-09-20 MED ORDER — ONDANSETRON HCL 4 MG PO TABS: 4.0000 mg | ORAL_TABLET | Freq: Four times a day (QID) | ORAL | Status: DC | PRN

## 2013-09-20 MED ORDER — ONDANSETRON HCL 4 MG/2ML IJ SOLN
4.0000 mg | Freq: Once | INTRAMUSCULAR | Status: AC
  Administered 2013-09-20: 4 mg via INTRAVENOUS
  Filled 2013-09-20: qty 2

## 2013-09-20 MED ORDER — ONDANSETRON HCL 4 MG/2ML IJ SOLN
4.0000 mg | Freq: Three times a day (TID) | INTRAMUSCULAR | Status: DC | PRN
  Filled 2013-09-20: qty 2

## 2013-09-20 MED ORDER — POTASSIUM CHLORIDE 10 MEQ/100ML IV SOLN
10.0000 meq | INTRAVENOUS | Status: AC
  Administered 2013-09-20 (×2): 10 meq via INTRAVENOUS
  Filled 2013-09-20 (×3): qty 100

## 2013-09-20 MED ORDER — ACETAMINOPHEN 325 MG PO TABS
650.0000 mg | ORAL_TABLET | Freq: Four times a day (QID) | ORAL | Status: DC | PRN
  Administered 2013-09-22: 650 mg via ORAL
  Filled 2013-09-20: qty 2

## 2013-09-20 NOTE — H&P (Signed)
[facility] History and Physical  Margaret PaceVeronica Rush WUJ:[id number] DOB: 1955/12/28 DOA: 09/20/2013  Referring physician: Emergency Department PCP: [name],JOHN C, MD  Specialists:   Chief Complaint: Abd pain  HPI: Margaret Rush is a 58 y.o. female  With a hx of htn who was recently diagnosed with diverticulitis less than one week ago and prescribed PO cipro and flagyl. The patient reports continued abd pain and nausea, subsequently not being able to tolerate PO meds. Symptoms worsened and the patient was then referred to the ED by her primary Gastroenterologist. In the ED, CT abd demonstrated evidence of acute diverticulitis. IV cipro and flagyl were started and the Hospitalist service consulted for admission.  Review of Systems:  Per above, the remainder of the 10pt ros reviewed and are neg  Past Medical History  Diagnosis Date  . Hypertension   . Diverticulitis    Past Surgical History  Procedure Laterality Date  . Laparoscopic hysterectomy    . Cholecystectomy     Social History:  reports that she has never smoked. She does not have any smokeless tobacco history on file. She reports that she drinks alcohol. Her drug history is not on file.  where does patient live--home, ALF, SNF? and with whom if at home?  Can patient participate in ADLs?  Allergies  Allergen Reactions  . No Known Allergies     No family history on file.  (be sure to complete)  Prior to Admission medications   Medication Sig Start Date End Date Taking? Authorizing Provider  Biotin 5000 MCG TABS Take 1 tablet by mouth daily.   Yes Historical Provider, MD  diltiazem (CARDIZEM) 120 MG tablet Take 120 mg by mouth daily.    Yes Historical Provider, MD  HYDROcodone-acetaminophen (NORCO) 7.5-325 MG per tablet Take 1 tablet by mouth every 6 (six) hours as needed for moderate pain.   Yes Historical Provider, MD  metroNIDAZOLE (FLAGYL) 500 MG tablet Take 500 mg by mouth 3 (three) times daily.   Yes Historical  Provider, MD  naproxen sodium (ALEVE) 220 MG tablet Take 440 mg by mouth as needed (as needed for pain).   Yes Historical Provider, MD  ondansetron (ZOFRAN ODT) 4 MG disintegrating tablet Take 1 tablet (4 mg total) by mouth every 8 (eight) hours as needed for nausea or vomiting. 09/17/13  Yes Elson AreasLeslie K Sofia, PA-C  Probiotic Product (PROBIOTIC DAILY PO) Take 1 tablet by mouth daily.   Yes Historical Provider, MD   Physical Exam: Filed Vitals:   09/20/13 1311 09/20/13 1435 09/20/13 1500  BP: 142/94 127/82 127/64  Pulse: 100 101 88  Temp: 98.4 F (36.9 C)    TempSrc: Oral    Resp: 18    SpO2: 100% 100% 95%     General:  Awake, in nad  Eyes: PERRL B  ENT: membranes moist, dentition fair  Neck: trachea midline, neck supple  Cardiovascular: regular, s1, s2  Respiratory: normal resp effort, no wheezing  Abdomen: soft, pos BS, generally tender, worse in LLQ  Skin: normal skin turgor, no abnormal skin lesions seen  Musculoskeletal: perfused, no clubbing  Psychiatric: mood/affect/normal // no auditory/visual hallucinations  Neurologic: cn2-12 grossly intact, strength/sensation intact  Labs on Admission:  Basic Metabolic Panel:  Recent Labs Lab 09/17/13 1425 09/20/13 1358  NA 144 142  K 3.2* 3.3*  CL 106 103  CO2 24 27  GLUCOSE 103* 174*  BUN 6 7  CREATININE 0.70 0.72  CALCIUM 9.8 9.6   Liver Function Tests:  Recent Labs  Lab 09/17/13 1425  AST 22  ALT 13  ALKPHOS 60  BILITOT 0.4  PROT 7.4  ALBUMIN 3.9   No results found for this basename: LIPASE, AMYLASE,  in the last 168 hours No results found for this basename: AMMONIA,  in the last 168 hours CBC:  Recent Labs Lab 09/17/13 1425 09/20/13 1358  WBC 5.1 4.6  NEUTROABS 3.1 2.5  HGB 13.3 13.3  HCT 40.3 40.0  MCV 84.3 82.5  PLT 285 293   Cardiac Enzymes: No results found for this basename: CKTOTAL, CKMB, CKMBINDEX, TROPONINI,  in the last 168 hours  BNP (last 3 results) No results found for this  basename: PROBNP,  in the last 8760 hours CBG: No results found for this basename: GLUCAP,  in the last 168 hours  Radiological Exams on Admission: No results found.  Assessment/Plan Principal Problem:   Acute diverticulitis Active Problems:   Diverticulitis   HTN (hypertension)   1. Acute diverticulitis 1. Was unable to tolerate PO abx secondary to n/v 2. Will cont on IV cipro/flagyl 3. Afebrile 4. No leukocytosis 5. Keep NPO for bowel rest 6. Admit to med-surg 2. HTN 1. BP stable and controlled 2. Cont to monitor 3. DVT prophylaxis 1. Heparin subQ  Code Status: Full Family Communication: Pt in room Disposition Plan: Pending   Time spent: 40min  Jerald KiefStephen K Chiu [facility] Pager [phone]  If 7PM-7AM, please contact night-coverage [email or web address] Password [email or web address] 09/20/2013, 4:53 PM

## 2013-09-20 NOTE — ED Notes (Signed)
PT states she was seen at [facility] on 3/28.  Took a  2 wk abx rx, followed up with her PCP, had a colonoscopy on Thursday.  States that she is continuing to have NVD.  Doctor sent her here for IV meds.

## 2013-09-20 NOTE — ED Provider Notes (Signed)
CSN: [id number]     Arrival date & time 09/20/13  1300 History   First MD Initiated Contact with Patient 09/20/13 1313     Chief Complaint  Patient presents with  . Diverticulitis     (Consider location/radiation/quality/duration/timing/severity/associated sxs/prior Treatment) HPI Comments: Patient is a 58 year old female with a past medical history of hypertension and diverticulitis who was diagnosed with acute [facility] ago [facility]. Patient was discharged with Vicodin, Cipro, Flagyl, and Zofran. The pain is aching and in her LLQ. She is unable to control the pain at home and denies any improvement in symptoms. She reports associated nausea, vomiting, and diarrhea. Patient spoke with a nurse at [facility] today who called to check on her. Patient advised the nurse of her symptoms and instructed the patient to go the ED. Patient's GI physician is Dr. Randa EvensEdwards. No alleviating/aggravating factors. Patient denies fever, headache, chest pain, SOB, dysuria, constipation.   Past Medical History  Diagnosis Date  . Hypertension   . Diverticulitis    Past Surgical History  Procedure Laterality Date  . Laparoscopic hysterectomy    . Cholecystectomy     No family history on file. History  Substance Use Topics  . Smoking status: Never Smoker   . Smokeless tobacco: Not on file  . Alcohol Use: Yes   OB History   Grav Para Term Preterm Abortions TAB SAB Ect Mult Living                 Review of Systems  Constitutional: Negative for fever, chills and fatigue.  HENT: Negative for trouble swallowing.   Eyes: Negative for visual disturbance.  Respiratory: Negative for shortness of breath.   Cardiovascular: Negative for chest pain and palpitations.  Gastrointestinal: Positive for nausea, vomiting, abdominal pain and diarrhea.  Genitourinary: Negative for dysuria and difficulty urinating.  Musculoskeletal: Negative for arthralgias and neck pain.  Skin: Negative for  color change.  Neurological: Negative for dizziness and weakness.  Psychiatric/Behavioral: Negative for dysphoric mood.      Allergies  No known allergies  Home Medications   Prior to Admission medications   Medication Sig Start Date End Date Taking? Authorizing Provider  Biotin 5000 MCG TABS Take 1 tablet by mouth daily.   Yes Historical Provider, MD  diltiazem (CARDIZEM) 120 MG tablet Take 120 mg by mouth daily.    Yes Historical Provider, MD  HYDROcodone-acetaminophen (NORCO) 7.5-325 MG per tablet Take 1 tablet by mouth every 6 (six) hours as needed for moderate pain.   Yes Historical Provider, MD  metroNIDAZOLE (FLAGYL) 500 MG tablet Take 500 mg by mouth 3 (three) times daily.   Yes Historical Provider, MD  naproxen sodium (ALEVE) 220 MG tablet Take 440 mg by mouth as needed (as needed for pain).   Yes Historical Provider, MD  ondansetron (ZOFRAN ODT) 4 MG disintegrating tablet Take 1 tablet (4 mg total) by mouth every 8 (eight) hours as needed for nausea or vomiting. 09/17/13  Yes Elson AreasLeslie K Sofia, PA-C  Probiotic Product (PROBIOTIC DAILY PO) Take 1 tablet by mouth daily.   Yes Historical Provider, MD   BP 142/94  Pulse 100  Temp(Src) 98.4 F (36.9 C) (Oral)  Resp 18  SpO2 100% Physical Exam  Nursing note and vitals reviewed. Constitutional: She is oriented to person, place, and time. She appears well-developed and well-nourished. No distress.  HENT:  Head: Normocephalic and atraumatic.  Eyes: Conjunctivae are normal.  Neck: Normal range of motion.  Cardiovascular: Normal rate and regular rhythm.  Exam reveals no gallop and no friction rub.   No murmur heard. Pulmonary/Chest: Effort normal and breath sounds normal. She has no wheezes. She has no rales. She exhibits no tenderness.  Abdominal: Soft. She exhibits no distension. There is tenderness. There is no rebound and no guarding.  LLQ tenderness to palpation. No peritoneal signs or other focal tenderness to palpation.    Musculoskeletal: Normal range of motion.  Neurological: She is alert and oriented to person, place, and time. Coordination normal.  Speech is goal-oriented. Moves limbs without ataxia.   Skin: Skin is warm and dry.  Psychiatric: She has a normal mood and affect. Her behavior is normal.    ED Course  Procedures (including critical care time) Labs Review Labs Reviewed  BASIC METABOLIC PANEL - Abnormal; Notable for the following:    Potassium 3.3 (*)    Glucose, Bld 174 (*)    All other components within normal limits  URINALYSIS, ROUTINE W REFLEX MICROSCOPIC - Abnormal; Notable for the following:    Color, Urine AMBER (*)    APPearance CLOUDY (*)    Leukocytes, UA SMALL (*)    All other components within normal limits  CBC WITH DIFFERENTIAL  URINE MICROSCOPIC-ADD ON    Imaging Review No results found.   EKG Interpretation None      MDM   Final diagnoses:  Diverticulitis    2:34 PM Labs and urinalysis pending. Patient reports [facility] that Dr. Randa [facility] wanted her to come to the hospital for IV antibiotics. Vitals stable and patient afebrile.   3:02 PM Labs show no acute changes. I will initiate IV Cipro and Flagyl in the ED. Patient will be admitted by Dr. Rhona Leavenshiu for IV antibiotic therapy. Dr. Bosie ClosSchooler with [facility] will see the patient.   Emilia BeckKaitlyn Syrah Daughtrey, PA-C 09/20/13 1503

## 2013-09-20 NOTE — Progress Notes (Addendum)
ANTIBIOTIC CONSULT NOTE - INITIAL  Pharmacy Consult for:  Ciprofloxacin Indication:  Diverticulitis  Allergies  Allergen Reactions  . No Known Allergies     Patient Measurements: 09/17/13 -- Height 62 inches  Weight 100.7 kg  Vital Signs: Temp: 98.4 F (36.9 C) (05/15 1311) Temp src: Oral (05/15 1311) BP: 127/64 mmHg (05/15 1500) Pulse Rate: 88 (05/15 1500)  Labs:  Recent Labs  09/20/13 1358  WBC 4.6  HGB 13.3  PLT 293  CREATININE 0.72   The estimated creatinine clearance is 95 ml/min using SCr 0.72 and the Cockroft-Gault equation.   Microbiology: No results found for this or any previous visit (from the past 720 hour(s)).  Medical History: Past Medical History  Diagnosis Date  . Hypertension   . Diverticulitis     Medications:  Scheduled:  . ciprofloxacin  400 mg Intravenous Once  . heparin  5,000 Units Subcutaneous 3 times per day  . metronidazole  500 mg Intravenous Q8H  . metronidazole  500 mg Intravenous Once  . potassium chloride  10 mEq Intravenous Q1 Hr x 4    Assessment: Asked to assist with IV Ciprofloxacin therapy for this 58 year-old female with diverticulitis.  Flagyl has also been ordered.  Goal of Therapy:  Eradication of infection  Plan:  Ciprofloxacin 400 mg IV every 12 hours  Morgan StanleyClaire Shaneequa Bahner R.Ph. 09/20/2013,4:00 PM

## 2013-09-20 NOTE — Consult Note (Signed)
Referring Provider: Dr. Rhona Leavenshiu Primary Care Physician:  [name],JOHN C, MD Primary Gastroenterologist:  Dr. Randa EvensEdwards  Reason for Consultation:  Diverticulitis  HPI: Margaret Rush is a 58 y.o. female who was initially diagnosed with diverticulitis in March (descending colon) and treated with Cipro/Flagyl, which failed to resolve her pain. She reports having constant sharp LLQ pain that would radiate across her lower abdomen since March. She would have recurrent N/V after taking the antibiotics during this time. She reports stopping the antibiotics around the time she had a colonoscopy in early May and that reportedly showed evidence of diverticulitis. CT on 09/17/13 showed acute diverticulitis of the distal descending colon and proximal sigmoid colon that has "slightly increased" compared to the CT in March. No abscess or perforation. The abx were resumed and when she could not tolerate due to N/V she was advised to go to the hospital. Decreased POs due to recurrent N/V. Subjective fevers and chills. Denies diverticulitis diagnosis prior to March 2015.   Past Medical History  Diagnosis Date  . Hypertension   . Diverticulitis     Past Surgical History  Procedure Laterality Date  . Laparoscopic hysterectomy    . Cholecystectomy      Prior to Admission medications   Medication Sig Start Date End Date Taking? Authorizing Provider  Biotin 5000 MCG TABS Take 1 tablet by mouth daily.   Yes Historical Provider, MD  diltiazem (CARDIZEM) 120 MG tablet Take 120 mg by mouth daily.    Yes Historical Provider, MD  HYDROcodone-acetaminophen (NORCO) 7.5-325 MG per tablet Take 1 tablet by mouth every 6 (six) hours as needed for moderate pain.   Yes Historical Provider, MD  metroNIDAZOLE (FLAGYL) 500 MG tablet Take 500 mg by mouth 3 (three) times daily.   Yes Historical Provider, MD  naproxen sodium (ALEVE) 220 MG tablet Take 440 mg by mouth as needed (as needed for pain).   Yes Historical Provider, MD   ondansetron (ZOFRAN ODT) 4 MG disintegrating tablet Take 1 tablet (4 mg total) by mouth every 8 (eight) hours as needed for nausea or vomiting. 09/17/13  Yes Elson AreasLeslie K Sofia, PA-C  Probiotic Product (PROBIOTIC DAILY PO) Take 1 tablet by mouth daily.   Yes Historical Provider, MD    Scheduled Meds: . ciprofloxacin  400 mg Intravenous Q12H  . heparin  5,000 Units Subcutaneous 3 times per day  . metronidazole  500 mg Intravenous Q8H  . potassium chloride  10 mEq Intravenous Q1 Hr x 4   Continuous Infusions: . sodium chloride 75 mL/hr at 09/20/13 1714   PRN Meds:.acetaminophen, acetaminophen, morphine injection, ondansetron (ZOFRAN) IV, ondansetron  Allergies as of 09/20/2013 - Review Complete 09/20/2013  Allergen Reaction Noted  . No known allergies  09/20/2013    No family history on file.  History   Social History  . Marital Status: Single    Spouse Name: N/A    Number of Children: N/A  . Years of Education: N/A   Occupational History  . Not on file.   Social History Main Topics  . Smoking status: Never Smoker   . Smokeless tobacco: Not on file  . Alcohol Use: Yes  . Drug Use: Not on file  . Sexual Activity: No   Other Topics Concern  . Not on file   Social History Narrative  . No narrative on file    Review of Systems: All negative from GI standpoint except as stated above in HPI.  Physical Exam: Vital signs: Filed Vitals:  09/20/13 1500  BP: 127/64  Pulse: 88  Temp: 98.4  Resp: 18     General:   Alert,  Well-developed, well-nourished, pleasant and cooperative in NAD HEENT: anicteric, no oral lesions Neck: supple, nontender Lungs:  Clear throughout to auscultation.   No wheezes, crackles, or rhonchi. No acute distress. Heart:  Regular rate and rhythm; no murmurs, clicks, rubs,  or gallops. Abdomen: Lower quadrant tenderness (greatest in LLQ), soft, nondistended, +BS, obese  Rectal:  Deferred Ext: no edema Neuro: oriented Skin: no rash  GI:  Lab  Results:  Recent Labs  09/20/13 1358  WBC 4.6  HGB 13.3  HCT 40.0  PLT 293   BMET  Recent Labs  09/20/13 1358  NA 142  K 3.3*  CL 103  CO2 27  GLUCOSE 174*  BUN 7  CREATININE 0.72  CALCIUM 9.6   LFT No results found for this basename: PROT, ALBUMIN, AST, ALT, ALKPHOS, BILITOT, BILIDIR, IBILI,  in the last 72 hours PT/INR No results found for this basename: LABPROT, INR,  in the last 72 hours   Studies/Results: No results found.  Impression/Plan: 58 yo with diverticulitis that has failed to tolerate or respond to outpt antibiotics and admitted for IV antibiotics, IVFs. Clear liquids ok. Agree with IV Cipro/Flagyl but may need transition to other IV Abx if pain does not improve in next 36-48 hours. Will follow.    LOS: 0 days   Shirley FriarVincent C. Finnigan Warriner  09/20/2013, 5:24 PM

## 2013-09-21 ENCOUNTER — Encounter (HOSPITAL_COMMUNITY): Payer: Self-pay | Admitting: *Deleted

## 2013-09-21 DIAGNOSIS — K529 Noninfective gastroenteritis and colitis, unspecified: Secondary | ICD-10-CM | POA: Diagnosis present

## 2013-09-21 DIAGNOSIS — R197 Diarrhea, unspecified: Secondary | ICD-10-CM

## 2013-09-21 LAB — COMPREHENSIVE METABOLIC PANEL
ALK PHOS: 47 U/L (ref 39–117)
ALT: 11 U/L (ref 0–35)
AST: 16 U/L (ref 0–37)
Albumin: 3.1 g/dL — ABNORMAL LOW (ref 3.5–5.2)
BUN: 4 mg/dL — AB (ref 6–23)
CO2: 23 meq/L (ref 19–32)
Calcium: 8.7 mg/dL (ref 8.4–10.5)
Chloride: 107 mEq/L (ref 96–112)
Creatinine, Ser: 0.67 mg/dL (ref 0.50–1.10)
GLUCOSE: 98 mg/dL (ref 70–99)
POTASSIUM: 3.7 meq/L (ref 3.7–5.3)
SODIUM: 141 meq/L (ref 137–147)
Total Bilirubin: 0.4 mg/dL (ref 0.3–1.2)
Total Protein: 6 g/dL (ref 6.0–8.3)

## 2013-09-21 LAB — CBC
HEMATOCRIT: 35 % — AB (ref 36.0–46.0)
HEMOGLOBIN: 11 g/dL — AB (ref 12.0–15.0)
MCH: 26.4 pg (ref 26.0–34.0)
MCHC: 31.4 g/dL (ref 30.0–36.0)
MCV: 83.9 fL (ref 78.0–100.0)
Platelets: 255 10*3/uL (ref 150–400)
RBC: 4.17 MIL/uL (ref 3.87–5.11)
RDW: 15 % (ref 11.5–15.5)
WBC: 4.9 10*3/uL (ref 4.0–10.5)

## 2013-09-21 MED ORDER — LOPERAMIDE HCL 2 MG PO CAPS
2.0000 mg | ORAL_CAPSULE | Freq: Two times a day (BID) | ORAL | Status: DC
  Administered 2013-09-21 – 2013-09-22 (×3): 2 mg via ORAL
  Filled 2013-09-21 (×4): qty 1

## 2013-09-21 MED ORDER — LOPERAMIDE HCL 2 MG PO CAPS: 2.0000 mg | ORAL_CAPSULE | ORAL | Status: DC | PRN

## 2013-09-21 MED ORDER — WITCH HAZEL-GLYCERIN EX PADS
MEDICATED_PAD | CUTANEOUS | Status: DC | PRN
  Filled 2013-09-21: qty 200

## 2013-09-21 NOTE — ED Provider Notes (Signed)
Medical screening examination/treatment/procedure(s) were performed by non-physician practitioner and as supervising physician I was immediately available for consultation/collaboration.   EKG Interpretation None        Skeeter Sheard T Labaron Digirolamo, MD 09/21/13 0700 

## 2013-09-21 NOTE — Progress Notes (Signed)
Dr. Rito EhrlichKrishnan MD does not feel at this time that pt has C-Diff.  Does not want culture done at this time.  Will continue to monitor for change in stools.  Has had diarrhea for a couple of months.  So this is not new for the pt.

## 2013-09-21 NOTE — Progress Notes (Signed)
[facility] PROGRESS NOTE  Margaret PaceVeronica Rush ZOX:[id number] DOB: 11/06/1955 DOA: 09/20/2013 PCP: [name],JOHN C, MD  Assessment/Plan: Principal Problem:   Acute diverticulitis: Stable. Continue IV antibiotics that she's not been able to tolerate by mouth. Pain and nausea control Active Problems:   Morbid obesity: Patient meets criteria given BMI of 40    HTN (hypertension): Holding by mouth meds. Overall stable.    Chronic diarrhea: Ongoing process times several months. No signs of infectious process. We will try when necessary Imodium  Code Status: Full code  Family Communication: Daughter at the bedside.  Disposition Plan: Home in a few days   Consultants:  Gastroenterology  Procedures:  None  Antibiotics:  IV Cipro 5/15-present  IV Flagyl 5/15-present  HPI/Subjective: Patient feeling pretty good. Still some continued diarrhea-which is chronic. No nausea. Pain control.  Objective: Filed Vitals:   09/21/13 1434  BP: 144/76  Pulse:   Temp:   Resp:     Intake/Output Summary (Last 24 hours) at 09/21/13 1503 Last data filed at 09/21/13 1300  Gross per 24 hour  Intake    622 ml  Output      0 ml  Net    622 ml   Filed Weights   09/20/13 1700  Weight: 100.699 kg (222 lb)    Exam:   General:  Alert and oriented x3, no acute distress  Cardiovascular: Regular rate and rhythm, S1-S2  Respiratory: Clear to auscultation bilaterally  Abdomen: Soft, obese, nontender, positive bowel sounds  Musculoskeletal: No clubbing or cyanosis   Data Reviewed: Basic Metabolic Panel:  Recent Labs Lab 09/17/13 1425 09/20/13 1358 09/21/13 0513  NA 144 142 141  Margaret 3.2* 3.3* 3.7  CL 106 103 107  CO2 24 27 23   GLUCOSE 103* 174* 98  BUN 6 7 4*  CREATININE 0.70 0.72 0.67  CALCIUM 9.8 9.6 8.7   Liver Function Tests:  Recent Labs Lab 09/17/13 1425 09/21/13 0513  AST 22 16  ALT 13 11  ALKPHOS 60 47  BILITOT 0.4 0.4  PROT 7.4 6.0  ALBUMIN 3.9 3.1*    No results found for this basename: LIPASE, AMYLASE,  in the last 168 hours No results found for this basename: AMMONIA,  in the last 168 hours CBC:  Recent Labs Lab 09/17/13 1425 09/20/13 1358 09/21/13 0513  WBC 5.1 4.6 4.9  NEUTROABS 3.1 2.5  --   HGB 13.3 13.3 11.0*  HCT 40.3 40.0 35.0*  MCV 84.3 82.5 83.9  PLT 285 293 255   Cardiac Enzymes: No results found for this basename: CKTOTAL, CKMB, CKMBINDEX, TROPONINI,  in the last 168 hours BNP (last 3 results) No results found for this basename: PROBNP,  in the last 8760 hours CBG: No results found for this basename: GLUCAP,  in the last 168 hours  No results found for this or any previous visit (from the past 240 hour(s)).   Studies: No results found.  Scheduled Meds: . ciprofloxacin  400 mg Intravenous Q12H  . heparin  5,000 Units Subcutaneous 3 times per day  . loperamide  2 mg Oral BID  . metronidazole  500 mg Intravenous Q8H   Continuous Infusions: . sodium chloride 75 mL/hr at 09/20/13 1714    Principal Problem:   Acute diverticulitis Active Problems:   Morbid obesity   HTN (hypertension)   Chronic diarrhea    Time spent: 15 minutes    Margaret Mordecai RasmussenK Rush  [facility] Pager [phone]. If 7PM-7AM, please contact night-coverage at [email or web address], password [email or web address]  09/21/2013, 3:03 PM  LOS: 1 day

## 2013-09-21 NOTE — Progress Notes (Signed)
Patient ID: Margaret Rush Velez, female   DOB: Jan 08, 1956, 58 y.o.   MRN: [id number] [facility]  Margaret Rush Austell 58 y.o. Jan 08, 1956   Subjective: Denies abdominal pain.Felt nauseous and better since antiemetic. Tolerating clears stating that she loves the jello.  Objective: Vital signs: Filed Vitals:   09/21/13 0551  BP: 123/77  Pulse: 81  Temp: 98.6 F (37 C)  Resp: 18    Physical Exam: Gen: alert, no acute distress  Abd: LLQ and suprapubic tenderness with guarding, soft, nondistended, +BS  Lab Results:  Recent Labs  09/20/13 1358 09/21/13 0513  NA 142 141  K 3.3* 3.7  CL 103 107  CO2 27 23  GLUCOSE 174* 98  BUN 7 4*  CREATININE 0.72 0.67  CALCIUM 9.6 8.7    Recent Labs  09/21/13 0513  AST 16  ALT 11  ALKPHOS 47  BILITOT 0.4  PROT 6.0  ALBUMIN 3.1*    Recent Labs  09/20/13 1358 09/21/13 0513  WBC 4.6 4.9  NEUTROABS 2.5  --   HGB 13.3 11.0*  HCT 40.0 35.0*  MCV 82.5 83.9  PLT 293 255      Assessment/Plan: Diverticulitis on IV Abx after failing PO Abx and having intolerance to PO Flagyl. Will change to full liquid diet for dinner and consider advancing further tomorrow. Continue IV Abx at least 2-3 days. Will likely need Augmentin when ready for discharge since having N/V to PO Flagyl prior to admit. Supportive care.   Shirley FriarVincent C. Larico Dimock 09/21/2013, 11:05 AM

## 2013-09-22 MED ORDER — LOPERAMIDE HCL 2 MG PO CAPS
2.0000 mg | ORAL_CAPSULE | Freq: Every day | ORAL | Status: DC
  Administered 2013-09-23: 2 mg via ORAL
  Filled 2013-09-22 (×2): qty 1

## 2013-09-22 MED ORDER — DILTIAZEM HCL 60 MG PO TABS: 120.0000 mg | ORAL_TABLET | Freq: Every day | ORAL | Status: DC

## 2013-09-22 MED ORDER — DILTIAZEM HCL ER COATED BEADS 120 MG PO CP24
120.0000 mg | ORAL_CAPSULE | Freq: Every day | ORAL | Status: DC
  Administered 2013-09-22 – 2013-09-24 (×3): 120 mg via ORAL
  Filled 2013-09-22 (×3): qty 1

## 2013-09-22 NOTE — Progress Notes (Signed)
INITIAL NUTRITION ASSESSMENT  DOCUMENTATION CODES Per approved criteria  -Morbid Obesity   INTERVENTION: - Patient educated on a low fiber, low fat diet for diverticulitis. We also reviewed transitioning to a higher fiber diet once symptoms resolve. Handouts provided. Teach back method used.  - Encourage adequate intake of Soft Diet.   NUTRITION DIAGNOSIS: Inadequate oral intake related to diverticulitis as evidenced by full liquid diet.   Goal: Patient will meet >/=90% of estimated nutrition needs  Monitor:  PO intake, diet tolerance, weights, labs, education needs  Reason for Assessment: Malnutrition screening tool  58 y.o. female  Admitting Dx: Acute diverticulitis  ASSESSMENT: 58 year old female patient with recent diagnosis of diverticulitis. She was admitted with abdominal pain and nausea.   Patient reports a fair appetite for the last several months, likely secondary to new diverticulitis. She has lost about 7 pounds in 2 months (3% of her usual weight). She was eating well on a full liquid diet. [facility] but patient has not received a tray. She does report some nausea earlier today, but this has improved.   We reviewed dietary strategies for diverticulitis.   Height: Ht Readings from Last 1 Encounters:  09/20/13 5\' 2"  (1.575 m)    Weight: Wt Readings from Last 1 Encounters:  09/20/13 222 lb (100.699 kg)    Ideal Body Weight: 110 pounds  % Ideal Body Weight: 200%  Wt Readings from Last 10 Encounters:  09/20/13 222 lb (100.699 kg)  09/17/13 222 lb (100.699 kg)  07/28/13 229 lb (103.874 kg)    Usual Body Weight: 229 pounds  % Usual Body Weight: 97%  BMI:  Body mass index is 40.59 kg/(m^2). Patient is morbidly obese.   Estimated Nutritional Needs: Kcal: 1850-2000 kcal Protein: 100-115 g Fluid: >2.5 L/day  Skin: Intact  Diet Order: [facility]  EDUCATION NEEDS: -Education needs addressed   Intake/Output Summary (Last 24 hours)  at 09/22/13 1404 Last data filed at 09/22/13 0828  Gross per 24 hour  Intake 2997.5 ml  Output      0 ml  Net 2997.5 ml    Last BM: 5/16   Labs:   Recent Labs Lab 09/17/13 1425 09/20/13 1358 09/21/13 0513  NA 144 142 141  K 3.2* 3.3* 3.7  CL 106 103 107  CO2 24 27 23   BUN 6 7 4*  CREATININE 0.70 0.72 0.67  CALCIUM 9.8 9.6 8.7  GLUCOSE 103* 174* 98    CBG (last 3)  No results found for this basename: GLUCAP,  in the last 72 hours  Scheduled Meds: . ciprofloxacin  400 mg Intravenous Q12H  . diltiazem  120 mg Oral Daily  . heparin  5,000 Units Subcutaneous 3 times per day  . [START ON 09/23/2013] loperamide  2 mg Oral Daily  . metronidazole  500 mg Intravenous Q8H    Continuous Infusions: . sodium chloride 10 mL/hr at 09/22/13 [id number]    Past Medical History  Diagnosis Date  . Hypertension   . Diverticulitis     Past Surgical History  Procedure Laterality Date  . Laparoscopic hysterectomy    . Cholecystectomy      Linnell FullingElyse Kasem Mozer, RD, LDN Pager #: [phone] After-Hours Pager #: [phone]

## 2013-09-22 NOTE — Progress Notes (Signed)
[facility] PROGRESS NOTE  Margaret Rush UJW:[id number] DOB: [birth year] DOA: 09/20/2013 PCP: [name],JOHN C, MD  Assessment/Plan: Principal Problem:   Acute diverticulitis: Stable. Continue IV antibiotics that she's not been able to tolerate by mouth. Pain and nausea control.  Patient doing okay. She is worried about getting over this. I reassured her. Active Problems:   Morbid obesity: Patient meets criteria given BMI of 40    HTN (hypertension): Blood pressure and heart rate starting to climb back up. Have restarted her home dose of Cardizem    Chronic diarrhea: Ongoing process times several months. No signs of infectious process. Responding well to scheduled Imodium, will decrease to once a day  Code Status: Full code  Family Communication: Spoke with daughter at the bedside yesterday  Disposition Plan: Home possibly tomorrow   Consultants:  Gastroenterology  Procedures:  None  Antibiotics:  IV Cipro 5/15-present  IV Flagyl 5/15-present  HPI/Subjective: Patient feeling pretty good. Diarrhea better with Imodium. Pain controlled  Objective: Filed Vitals:   09/21/13 2140  BP: 133/69  Pulse: 89  Temp: 98.1 F (36.7 C)  Resp: 20    Intake/Output Summary (Last 24 hours) at 09/22/13 0923 Last data filed at 09/22/13 [id number]  Gross per 24 hour  Intake 3237.5 ml  Output      0 ml  Net 3237.5 ml   Filed Weights   09/20/13 1700  Weight: 100.699 kg (222 lb)    Exam:   General:  Alert and oriented x3, mildly anxious  Cardiovascular: Regular rate and rhythm, S1-S2  Respiratory: Clear to auscultation bilaterally  Abdomen: Soft, obese, minimal tenderness in left lower quadrant, positive bowel sounds  Musculoskeletal: No clubbing or cyanosis   Data Reviewed: Basic Metabolic Panel:  Recent Labs Lab 09/17/13 1425 09/20/13 1358 09/21/13 0513  NA 144 142 141  K 3.2* 3.3* 3.7  CL 106 103 107  CO2 24 27 23   GLUCOSE 103* 174* 98  BUN 6 7 4*   CREATININE 0.70 0.72 0.67  CALCIUM 9.8 9.6 8.7   Liver Function Tests:  Recent Labs Lab 09/17/13 1425 09/21/13 0513  AST 22 16  ALT 13 11  ALKPHOS 60 47  BILITOT 0.4 0.4  PROT 7.4 6.0  ALBUMIN 3.9 3.1*   No results found for this basename: LIPASE, AMYLASE,  in the last 168 hours No results found for this basename: AMMONIA,  in the last 168 hours CBC:  Recent Labs Lab 09/17/13 1425 09/20/13 1358 09/21/13 0513  WBC 5.1 4.6 4.9  NEUTROABS 3.1 2.5  --   HGB 13.3 13.3 11.0*  HCT 40.3 40.0 35.0*  MCV 84.3 82.5 83.9  PLT 285 293 255   Cardiac Enzymes: No results found for this basename: CKTOTAL, CKMB, CKMBINDEX, TROPONINI,  in the last 168 hours BNP (last 3 results) No results found for this basename: PROBNP,  in the last 8760 hours CBG: No results found for this basename: GLUCAP,  in the last 168 hours  No results found for this or any previous visit (from the past 240 hour(s)).   Studies: No results found.  Scheduled Meds: . ciprofloxacin  400 mg Intravenous Q12H  . heparin  5,000 Units Subcutaneous 3 times per day  . [START ON 09/23/2013] loperamide  2 mg Oral Daily  . metronidazole  500 mg Intravenous Q8H   Continuous Infusions: . sodium chloride 75 mL/hr at 09/20/13 1714    Principal Problem:   Acute diverticulitis Active Problems:   Morbid obesity   HTN (hypertension)  Chronic diarrhea    Time spent: 15 minutes    Bertis Hustead Mordecai RasmussenK Briani Maul  [facility] Pager [phone]. If 7PM-7AM, please contact night-coverage at [email or web address], password [email or web address] 09/22/2013, 9:23 AM  LOS: 2 days

## 2013-09-22 NOTE — Progress Notes (Signed)
Patient ID: Beverely PaceVeronica Lumpkin, female   DOB: [birth year], 58 y.o.   MRN: [id number] [facility]  Beverely PaceVeronica Perazzo 58 y.o. [birth year]   Subjective: Sitting in chair. Nauseous earlier this morning until received an antiemetic. Denies vomiting. Feels abdominal pain is less. Tolerating full liquids.  Objective: VS:       BP: 135/91  Pulse: 89  Temp: 98.1   Physical Exam: Gen: alert, no acute distress Abd: less tender in LLQ, guarding in LLQ, mild tenderness in suprapubic area, soft, nondistended, +BS  Lab Results:  Recent Labs  09/20/13 1358 09/21/13 0513  NA 142 141  K 3.3* 3.7  CL 103 107  CO2 27 23  GLUCOSE 174* 98  BUN 7 4*  CREATININE 0.72 0.67  CALCIUM 9.6 8.7    Recent Labs  09/21/13 0513  AST 16  ALT 11  ALKPHOS 47  BILITOT 0.4  PROT 6.0  ALBUMIN 3.1*    Recent Labs  09/20/13 1358 09/21/13 0513  WBC 4.6 4.9  NEUTROABS 2.5  --   HGB 13.3 11.0*  HCT 40.0 35.0*  MCV 82.5 83.9  PLT 293 255   No results found for this basename: LABPROT, INR,  in the last 72 hours    Assessment/Plan: Acute diverticulitis who failed and was intolerant to outpt Abx (Cipro/Flagyl). Symptoms have improved on IV Abx. Would change to PO Augmentin tomorrow if continues to do well and would not rechallenge with PO Flagyl because of duration of previous course and likely intolerance to it. If tolerates PO Augmentin and tolerates advancement of diet, then home Tues on PO Abx. If fails to tolerate PO Augmentin, then will need home IV Abx. Changed to soft diet and advance as tolerated to carb modified diet. F/U with Dr. Randa EvensEdwards in 2-3 weeks. Dr. Gomez CleverlyBuccini/Outlaw to see tomorrow.   Shirley FriarVincent C. Naylah Cork 09/22/2013, 11:57 AM

## 2013-09-23 MED ORDER — AMOXICILLIN-POT CLAVULANATE 875-125 MG PO TABS
1.0000 | ORAL_TABLET | Freq: Two times a day (BID) | ORAL | Status: DC
  Administered 2013-09-23 – 2013-09-24 (×2): 1 via ORAL
  Filled 2013-09-23 (×4): qty 1

## 2013-09-23 MED ORDER — DIPHENHYDRAMINE HCL 25 MG PO CAPS
25.0000 mg | ORAL_CAPSULE | Freq: Three times a day (TID) | ORAL | Status: DC | PRN
  Administered 2013-09-23: 25 mg via ORAL
  Filled 2013-09-23: qty 1

## 2013-09-23 NOTE — Progress Notes (Signed)
[facility] PROGRESS NOTE  Beverely PaceVeronica Koper [id number] DOB: Jun 09, 1955 DOA: 09/20/2013 PCP: [name],JOHN C, MD  Assessment/Plan: Principal Problem:   Acute diverticulitis: Stable. Advancing to soft diet. Has started by mouth Augmentin. Pain and nausea control.  Hopefully she can tolerate this Active Problems:   Morbid obesity: Patient meets criteria given BMI of 40    HTN (hypertension): Improved once home Cardizem restarted    Chronic diarrhea: Ongoing process times several months. No signs of infectious process. Responding well to scheduled plus when necessary Imodium  Code Status: Full code  Family Communication: Friend at the bedside  Disposition Plan: Home possibly tomorrow   Consultants:  Gastroenterology  Procedures:  None  Antibiotics:  IV Cipro 5/15-5/18  IV Flagyl 5/15-5/18  By mouth Augmentin 5/18-present  HPI/Subjective: Patient doing okay. Tolerating soft diet. Had some nausea earlier today. No pain  Objective: Filed Vitals:   09/23/13 0556  BP: 128/73  Pulse: 78  Temp: 98.4 F (36.9 C)  Resp: 18    Intake/Output Summary (Last 24 hours) at 09/23/13 1313 Last data filed at 09/22/13 1400  Gross per 24 hour  Intake    240 ml  Output      0 ml  Net    240 ml   Filed Weights   09/20/13 1700  Weight: 100.699 kg (222 lb)    Exam:   General:  Alert and oriented x3, no acute distress  Cardiovascular: Regular rate and rhythm, S1-S2  Respiratory: Clear to auscultation bilaterally  Abdomen: Soft, obese, nontender, positive bowel sounds  Musculoskeletal: No clubbing or cyanosis   Data Reviewed: Basic Metabolic Panel:  Recent Labs Lab 09/17/13 1425 09/20/13 1358 09/21/13 0513  NA 144 142 141  K 3.2* 3.3* 3.7  CL 106 103 107  CO2 24 27 23   GLUCOSE 103* 174* 98  BUN 6 7 4*  CREATININE 0.70 0.72 0.67  CALCIUM 9.8 9.6 8.7   Liver Function Tests:  Recent Labs Lab 09/17/13 1425 09/21/13 0513  AST 22 16  ALT 13 11   ALKPHOS 60 47  BILITOT 0.4 0.4  PROT 7.4 6.0  ALBUMIN 3.9 3.1*   No results found for this basename: LIPASE, AMYLASE,  in the last 168 hours No results found for this basename: AMMONIA,  in the last 168 hours CBC:  Recent Labs Lab 09/17/13 1425 09/20/13 1358 09/21/13 0513  WBC 5.1 4.6 4.9  NEUTROABS 3.1 2.5  --   HGB 13.3 13.3 11.0*  HCT 40.3 40.0 35.0*  MCV 84.3 82.5 83.9  PLT 285 293 255   Cardiac Enzymes: No results found for this basename: CKTOTAL, CKMB, CKMBINDEX, TROPONINI,  in the last 168 hours BNP (last 3 results) No results found for this basename: PROBNP,  in the last 8760 hours CBG: No results found for this basename: GLUCAP,  in the last 168 hours  No results found for this or any previous visit (from the past 240 hour(s)).   Studies: No results found.  Scheduled Meds: . amoxicillin-clavulanate  1 tablet Oral Q12H  . diltiazem  120 mg Oral Daily  . heparin  5,000 Units Subcutaneous 3 times per day  . loperamide  2 mg Oral Daily   Continuous Infusions: . sodium chloride 10 mL/hr at 09/22/13 1825    Principal Problem:   Acute diverticulitis Active Problems:   Morbid obesity   HTN (hypertension)   Chronic diarrhea    Time spent: 15 minutes    Shayle Donahoo Mordecai RasmussenK Jimeka Balan  [facility] Pager [phone].  If 7PM-7AM, please contact night-coverage at [email or web address], password [email or web address] 09/23/2013, 1:13 PM  LOS: 3 days

## 2013-09-23 NOTE — Progress Notes (Signed)
Subjective: Abdominal pain and nausea improving. Tolerating soft diet.  Objective: Vital signs in last 24 hours: Temp:  [97.8 F (36.6 C)-98.4 F (36.9 C)] 98.4 F (36.9 C) (05/18 0556) Pulse Rate:  [78-91] 78 (05/18 0556) Resp:  [18] 18 (05/18 0556) BP: (125-148)/(54-87) 128/73 mmHg (05/18 0556) SpO2:  [94 %-100 %] 94 % (05/18 0556) Weight change:  Last BM Date: 09/21/13  PE: GEN:  NAD, overweight ABD:  Soft, mild left-sided abdominal tenderness without peritonitis, hypoactive bowel sounds  Assessment:  1.  Diverticulitis, refractory to outpatient therapy, improving on oral antibiotics, recent repeat CT scan without complicating features (no abscess, microperforation, etc.).  Plan:  1.  Soft diet for now, wouldn't advance for the next several days. 2.  Transition from intravenous to oral antibiotics (change from IV metronidazole and ciprofloxacin to oral Augmentin). 3.  OOBTC, ambulate halls. 4.  Hopefully home tomorrow, would complete 14 day total course of antibiotics starting from date of admission. 5.  Will follow.   Margaret ModenaWilliam Kwame Rush 09/23/2013, 11:44 AM

## 2013-09-24 MED ORDER — LOPERAMIDE HCL 2 MG PO CAPS: 2.0000 mg | ORAL_CAPSULE | ORAL | Status: DC | PRN

## 2013-09-24 MED ORDER — AMOXICILLIN-POT CLAVULANATE 875-125 MG PO TABS: 1.0000 | ORAL_TABLET | Freq: Two times a day (BID) | ORAL | Status: AC

## 2013-09-24 MED ORDER — HYDROCODONE-ACETAMINOPHEN 7.5-325 MG PO TABS: 1.0000 | ORAL_TABLET | Freq: Four times a day (QID) | ORAL | Status: DC | PRN

## 2013-09-24 NOTE — Discharge Instructions (Signed)
Diverticulitis °A diverticulum is a small pouch or sac on the colon. Diverticulosis is the presence of these diverticula on the colon. Diverticulitis is the irritation (inflammation) or infection of diverticula. °CAUSES  °The colon and its diverticula contain bacteria. If food particles block the tiny opening to a diverticulum, the bacteria inside can grow and cause an increase in pressure. This leads to infection and inflammation and is called diverticulitis. °SYMPTOMS  °· Abdominal pain and tenderness. Usually, the pain is located on the left side of your abdomen. However, it could be located elsewhere. °· Fever. °· Bloating. °· Feeling sick to your stomach (nausea). °· Throwing up (vomiting). °· Abnormal stools. °DIAGNOSIS  °Your caregiver will take a history and perform a physical exam. Since many things can cause abdominal pain, other tests may be necessary. Tests may include: °· Blood tests. °· Urine tests. °· X-ray of the abdomen. °· CT scan of the abdomen. °Sometimes, surgery is needed to determine if diverticulitis or other conditions are causing your symptoms. °TREATMENT  °Most of the time, you can be treated without surgery. Treatment includes: °· Resting the bowels by only having liquids for a few days. As you improve, you will need to eat a low-fiber diet. °· Intravenous (IV) fluids if you are losing body fluids (dehydrated). °· Antibiotic medicines that treat infections may be given. °· Pain and nausea medicine, if needed. °· Surgery if the inflamed diverticulum has burst. °HOME CARE INSTRUCTIONS  °· Try a clear liquid diet (broth, tea, or water for as long as directed by your caregiver). You may then gradually begin a low-fiber diet as tolerated.  °A low-fiber diet is a diet with less than 10 grams of fiber. Choose the foods below to reduce fiber in the diet: °· White breads, cereals, rice, and pasta. °· Cooked fruits and vegetables or soft fresh fruits and vegetables without the skin. °· Ground or  well-cooked tender beef, ham, veal, lamb, pork, or poultry. °· Eggs and seafood. °· After your diverticulitis symptoms have improved, your caregiver may put you on a high-fiber diet. A high-fiber diet includes 14 grams of fiber for every 1000 calories consumed. For a standard 2000 calorie diet, you would need 28 grams of fiber. Follow these diet guidelines to help you increase the fiber in your diet. It is important to slowly increase the amount fiber in your diet to avoid gas, constipation, and bloating. °· Choose whole-grain breads, cereals, pasta, and brown rice. °· Choose fresh fruits and vegetables with the skin on. Do not overcook vegetables because the more vegetables are cooked, the more fiber is lost. °· Choose more nuts, seeds, legumes, dried peas, beans, and lentils. °· Look for food products that have greater than 3 grams of fiber per serving on the Nutrition Facts label. °· Take all medicine as directed by your caregiver. °· If your caregiver has given you a follow-up appointment, it is very important that you go. Not going could result in lasting (chronic) or permanent injury, pain, and disability. If there is any problem keeping the appointment, call to reschedule. °SEEK MEDICAL CARE IF:  °· Your pain does not improve. °· You have a hard time advancing your diet beyond clear liquids. °· Your bowel movements do not return to normal. °SEEK IMMEDIATE MEDICAL CARE IF:  °· Your pain becomes worse. °· You have an oral temperature above 102° F (38.9° C), not controlled by medicine. °· You have repeated vomiting. °· You have bloody or black, tarry stools. °·   Symptoms that brought you to your caregiver become worse or are not getting better. °MAKE SURE YOU:  °· Understand these instructions. °· Will watch your condition. °· Will get help right away if you are not doing well or get worse. °Document Released: 02/02/2005 Document Revised: 07/18/2011 Document Reviewed: 05/31/2010 °[employer]® Patient Information  ©2014 [employer]. ° °

## 2013-09-25 NOTE — Discharge Summary (Signed)
Physician Discharge Summary  Margaret Rush MEQ:[id number] DOB: Aug 27, 1955 DOA: 09/20/2013  PCP: Houston Siren, MD  Admit date: 09/20/2013 Discharge date: 09/24/2013  Time spent: 25 minutes  Recommendations for Outpatient Follow-up:  1. New medications: Augmentin 875/125 by mouth twice a day x10 more days for total of 2 weeks of therapy 2. Will followup with her PCP in one month 3. Cleared to return back to work in one week  Discharge Diagnoses:  Principal Problem:   Acute diverticulitis Active Problems:   Morbid obesity   HTN (hypertension)   Chronic diarrhea   Discharge Condition: Improved, being discharged home  Diet recommendation: Low-sodium  Filed Weights   09/20/13 1700  Weight: 100.699 kg (222 lb)    History of present illness:  Patient with past oral history of hypertension and obesity diagnosed with recent diverticulitis and started on by mouth Cipro and Flagyl, but unable to tolerate secondary to much nausea and vomiting positive abdominal pain. She was then admitted and started on IV antibiotics for diverticulitis and admitted to the hospitalist service  Hospital Course:  Principal Problem:   Acute diverticulitis: Patient initially started on by mouth Cipro and Flagyl which was unable to tolerate secondary to nausea and vomiting. She was admitted and started on IV Cipro and Flagyl which she tolerated well. After several days, her white count normalized and she was tolerating some by mouth, she was changed over to by mouth Augmentin which was able to tolerate she was discharged home Active Problems:   Morbid obesity: Patient met criteria given BMI greater than 40    HTN (hypertension): Stable. Continued on her home blood pressure medications    Chronic diarrhea: Patient's had this problem for several months. She did respond well to some scheduled plus when necessary Imodium  Procedures:  None  Consultations:  Gastroenterology  Discharge Exam: Filed  Vitals:   09/24/13 0948  BP: 130/88  Pulse:   Temp:   Resp:     General: Alert and oriented x3, no acute distress Cardiovascular: Regular rate and rhythm, S1-S2 Respiratory: Clear to auscultation bilaterally  Discharge Instructions You were cared for by a hospitalist during your hospital stay. If you have any questions about your discharge medications or the care you received while you were in the hospital after you are discharged, you can call the unit and asked to speak with the hospitalist on call if the hospitalist that took care of you is not available. Once you are discharged, your primary care physician will handle any further medical issues. Please note that NO REFILLS for any discharge medications will be authorized once you are discharged, as it is imperative that you return to your primary care physician (or establish a relationship with a primary care physician if you do not have one) for your aftercare needs so that they can reassess your need for medications and monitor your lab values.  Discharge Instructions   Diet - low sodium heart healthy    Complete by:  As directed      Increase activity slowly    Complete by:  As directed             Medication List         ALEVE 220 MG tablet  Generic drug:  naproxen sodium  Take 440 mg by mouth as needed (as needed for pain).     amoxicillin-clavulanate 875-125 MG per tablet  Commonly known as:  AUGMENTIN  Take 1 tablet by mouth every 12 (twelve) hours.  Biotin 5000 MCG Tabs  Take 1 tablet by mouth daily.     diltiazem 120 MG 24 hr tablet  Commonly known as:  CARDIZEM LA  Take 120 mg by mouth daily.     HYDROcodone-acetaminophen 7.5-325 MG per tablet  Commonly known as:  NORCO  Take 1 tablet by mouth every 6 (six) hours as needed for moderate pain.     loperamide 2 MG capsule  Commonly known as:  IMODIUM  Take 1 capsule (2 mg total) by mouth as needed for diarrhea or loose stools.     metroNIDAZOLE 500 MG  tablet  Commonly known as:  FLAGYL  Take 500 mg by mouth 3 (three) times daily.     ondansetron 4 MG disintegrating tablet  Commonly known as:  ZOFRAN ODT  Take 1 tablet (4 mg total) by mouth every 8 (eight) hours as needed for nausea or vomiting.     PROBIOTIC DAILY PO  Take 1 tablet by mouth daily.       Allergies  Allergen Reactions  . No Known Allergies        Follow-up Information   Follow up with [facility], MD In 1 month.   Specialty:  Family Medicine   Contact information:   [address] [place] [address] [phone]        The results of significant diagnostics from this hospitalization (including imaging, microbiology, ancillary and laboratory) are listed below for reference.    Significant Diagnostic Studies: Ct Abdomen Pelvis W Contrast  09/17/2013   CLINICAL DATA:  Abdominal pain, loose stools, nausea, history hypertension, asthma, diverticulitis, hysterectomy, cholecystectomy  EXAM: CT ABDOMEN AND PELVIS WITH CONTRAST  TECHNIQUE: Multidetector CT imaging of the abdomen and pelvis was performed using the standard protocol following bolus administration of intravenous contrast. Sagittal and coronal MPR images reconstructed from axial data set.  CONTRAST:  167m OMNIPAQUE IOHEXOL 300 MG/ML SOLN IV. Dilute oral contrast.  COMPARISON:  07/28/2013  FINDINGS: Lung bases clear.  Post cholecystectomy and hysterectomy.  10 mm complex lesion mid LEFT kidney image 38.  Liver, spleen, pancreas, kidneys, and adrenal glands otherwise normal appearance.  Wall thickening of the distal descending and proximal sigmoid colon with surrounding inflammatory changes in identification of scattered colonic diverticula.  Findings are consistent with acute diverticulitis, slightly progressive versus previous exam.  Scattered reactive mesenteric lymph nodes.  No evidence of perforation or abscess.  Unremarkable bladder, ureters, ovaries and appendix.  Stomach and small bowel loops  normal appearance.  No mass, adenopathy, free fluid or hernia.  Osseous structures unremarkable.  IMPRESSION: Acute diverticulitis of the distal descending/proximal sigmoid colon, slightly increased in extent versus previous exam.  No evidence of perforation or abscess.   Electronically Signed   By: MLavonia DanaM.D.   On: 09/17/2013 17:01    Microbiology: No results found for this or any previous visit (from the past 240 hour(s)).   Labs: Basic Metabolic Panel:  Recent Labs Lab 09/20/13 1358 09/21/13 0513  NA 142 141  K 3.3* 3.7  CL 103 107  CO2 27 23  GLUCOSE 174* 98  BUN 7 4*  CREATININE 0.72 0.67  CALCIUM 9.6 8.7   Liver Function Tests:  Recent Labs Lab 09/21/13 0513  AST 16  ALT 11  ALKPHOS 47  BILITOT 0.4  PROT 6.0  ALBUMIN 3.1*   No results found for this basename: LIPASE, AMYLASE,  in the last 168 hours No results found for this basename: AMMONIA,  in the  last 168 hours CBC:  Recent Labs Lab 09/20/13 1358 09/21/13 0513  WBC 4.6 4.9  NEUTROABS 2.5  --   HGB 13.3 11.0*  HCT 40.0 35.0*  MCV 82.5 83.9  PLT 293 255   Cardiac Enzymes: No results found for this basename: CKTOTAL, CKMB, CKMBINDEX, TROPONINI,  in the last 168 hours BNP: BNP (last 3 results) No results found for this basename: PROBNP,  in the last 8760 hours CBG: No results found for this basename: GLUCAP,  in the last 168 hours     Signed:  [facility] 09/25/2013, 5:05 PM

## 2015-03-17 ENCOUNTER — Encounter (HOSPITAL_COMMUNITY): Payer: Self-pay | Admitting: Emergency Medicine

## 2015-03-17 ENCOUNTER — Emergency Department (HOSPITAL_COMMUNITY): Payer: BC Managed Care – PPO

## 2015-03-17 ENCOUNTER — Emergency Department (HOSPITAL_COMMUNITY)
Admission: EM | Admit: 2015-03-17 | Discharge: 2015-03-17 | Disposition: A | Discharge status: Home / Self Care | Payer: BC Managed Care – PPO | Attending: Emergency Medicine | Admitting: Emergency Medicine

## 2015-03-17 DIAGNOSIS — Z79899 Other long term (current) drug therapy: Secondary | ICD-10-CM | POA: Diagnosis not present

## 2015-03-17 DIAGNOSIS — I10 Essential (primary) hypertension: Secondary | ICD-10-CM | POA: Insufficient documentation

## 2015-03-17 DIAGNOSIS — K5792 Diverticulitis of intestine, part unspecified, without perforation or abscess without bleeding: Secondary | ICD-10-CM | POA: Insufficient documentation

## 2015-03-17 DIAGNOSIS — R1084 Generalized abdominal pain: Secondary | ICD-10-CM | POA: Diagnosis present

## 2015-03-17 DIAGNOSIS — Z7951 Long term (current) use of inhaled steroids: Secondary | ICD-10-CM | POA: Insufficient documentation

## 2015-03-17 HISTORY — DX: Other seasonal allergic rhinitis: J30.2

## 2015-03-17 LAB — CBC
HCT: 44.1 % (ref 36.0–46.0)
Hemoglobin: 14.4 g/dL (ref 12.0–15.0)
MCH: 27.5 pg (ref 26.0–34.0)
MCHC: 32.7 g/dL (ref 30.0–36.0)
MCV: 84.3 fL (ref 78.0–100.0)
PLATELETS: 293 10*3/uL (ref 150–400)
RBC: 5.23 MIL/uL — AB (ref 3.87–5.11)
RDW: 14.3 % (ref 11.5–15.5)
WBC: 5.8 10*3/uL (ref 4.0–10.5)

## 2015-03-17 LAB — COMPREHENSIVE METABOLIC PANEL
ALT: 12 U/L — ABNORMAL LOW (ref 14–54)
AST: 21 U/L (ref 15–41)
Albumin: 4.4 g/dL (ref 3.5–5.0)
Alkaline Phosphatase: 70 U/L (ref 38–126)
Anion gap: 8 (ref 5–15)
BUN: 8 mg/dL (ref 6–20)
CALCIUM: 9.4 mg/dL (ref 8.9–10.3)
CHLORIDE: 109 mmol/L (ref 101–111)
CO2: 25 mmol/L (ref 22–32)
Creatinine, Ser: 0.72 mg/dL (ref 0.44–1.00)
Glucose, Bld: 93 mg/dL (ref 65–99)
Potassium: 3.5 mmol/L (ref 3.5–5.1)
Sodium: 142 mmol/L (ref 135–145)
Total Bilirubin: 1.1 mg/dL (ref 0.3–1.2)
Total Protein: 7.5 g/dL (ref 6.5–8.1)

## 2015-03-17 LAB — I-STAT CHEM 8, ED
BUN: 6 mg/dL (ref 6–20)
Calcium, Ion: 1.19 mmol/L (ref 1.12–1.23)
Chloride: 106 mmol/L (ref 101–111)
Creatinine, Ser: 0.8 mg/dL (ref 0.44–1.00)
Glucose, Bld: 89 mg/dL (ref 65–99)
HCT: 46 % (ref 36.0–46.0)
HEMOGLOBIN: 15.6 g/dL — AB (ref 12.0–15.0)
Potassium: 3.5 mmol/L (ref 3.5–5.1)
Sodium: 144 mmol/L (ref 135–145)
TCO2: 23 mmol/L (ref 0–100)

## 2015-03-17 LAB — URINALYSIS, ROUTINE W REFLEX MICROSCOPIC
Bilirubin Urine: NEGATIVE
Glucose, UA: NEGATIVE mg/dL
Hgb urine dipstick: NEGATIVE
Ketones, ur: NEGATIVE mg/dL
LEUKOCYTES UA: NEGATIVE
NITRITE: NEGATIVE
PROTEIN: NEGATIVE mg/dL
Specific Gravity, Urine: 1.014 (ref 1.005–1.030)
Urobilinogen, UA: 0.2 mg/dL (ref 0.0–1.0)
pH: 7.5 (ref 5.0–8.0)

## 2015-03-17 LAB — LIPASE, BLOOD: LIPASE: 20 U/L (ref 11–51)

## 2015-03-17 MED ORDER — METRONIDAZOLE IN NACL 5-0.79 MG/ML-% IV SOLN
500.0000 mg | Freq: Once | INTRAVENOUS | Status: AC
  Administered 2015-03-17: 500 mg via INTRAVENOUS
  Filled 2015-03-17: qty 100

## 2015-03-17 MED ORDER — IOHEXOL 300 MG/ML  SOLN
50.0000 mL | Freq: Once | INTRAMUSCULAR | Status: AC | PRN
  Administered 2015-03-17: 50 mL via ORAL

## 2015-03-17 MED ORDER — MORPHINE SULFATE (PF) 4 MG/ML IV SOLN
4.0000 mg | Freq: Once | INTRAVENOUS | Status: AC
  Administered 2015-03-17: 4 mg via INTRAVENOUS
  Filled 2015-03-17: qty 1

## 2015-03-17 MED ORDER — IOHEXOL 300 MG/ML  SOLN
100.0000 mL | Freq: Once | INTRAMUSCULAR | Status: AC | PRN
  Administered 2015-03-17: 100 mL via INTRAVENOUS

## 2015-03-17 MED ORDER — METRONIDAZOLE 500 MG PO TABS: 500.0000 mg | ORAL_TABLET | Freq: Two times a day (BID) | ORAL | Status: DC

## 2015-03-17 MED ORDER — CIPROFLOXACIN IN D5W 400 MG/200ML IV SOLN
400.0000 mg | Freq: Once | INTRAVENOUS | Status: AC
  Administered 2015-03-17: 400 mg via INTRAVENOUS
  Filled 2015-03-17: qty 200

## 2015-03-17 MED ORDER — FLUCONAZOLE 200 MG PO TABS: 200.0000 mg | ORAL_TABLET | Freq: Every day | ORAL | Status: DC

## 2015-03-17 MED ORDER — CIPROFLOXACIN HCL 500 MG PO TABS: 500.0000 mg | ORAL_TABLET | Freq: Two times a day (BID) | ORAL | Status: DC

## 2015-03-17 MED ORDER — ONDANSETRON 4 MG PO TBDP: ORAL_TABLET | ORAL | Status: DC

## 2015-03-17 MED ORDER — AMOXICILLIN-POT CLAVULANATE 875-125 MG PO TABS: 1.0000 | ORAL_TABLET | Freq: Two times a day (BID) | ORAL | Status: DC

## 2015-03-17 MED ORDER — ONDANSETRON HCL 4 MG/2ML IJ SOLN
4.0000 mg | Freq: Once | INTRAMUSCULAR | Status: AC | PRN
  Administered 2015-03-17: 4 mg via INTRAVENOUS
  Filled 2015-03-17: qty 2

## 2015-03-17 NOTE — ED Provider Notes (Signed)
CSN: [id number]     Arrival date & time 03/17/15  1002 History   First MD Initiated Contact with Patient 03/17/15 1021     Chief Complaint  Patient presents with  . Abdominal Pain     (Consider location/radiation/quality/duration/timing/severity/associated sxs/prior Treatment) Patient is a 59 y.o. female presenting with abdominal pain. The history is provided by the patient.  Abdominal Pain Pain location:  Generalized Pain quality: cramping, sharp and shooting   Pain radiates to:  Back Pain severity:  Severe Onset quality:  Sudden Duration:  2 days Timing:  Intermittent Progression:  Worsening Chronicity:  Recurrent (feels like prior diverticulitis) Relieved by: narcotics. Worsened by:  Nothing tried Ineffective treatments:  None tried Associated symptoms: diarrhea   Associated symptoms: no chest pain, no chills, no dysuria, no fever, no nausea, no shortness of breath and no vomiting     59 yo F with a chief complaint of lower abdominal pain. Asian has a history of diverticulitis and feels like this similar. Denies fevers chills. Initially started about a week ago and then spontaneous resolved after couple days. Reoccurred yesterday. Crampy periumbilical shooting to her back. Nausea but no vomiting. Positive for diarrhea. Denies bloody or dark. Patient called her GI doctor on the phone who suggested she come to the ED for evaluation. Pain is worse with movement or palpation.  Past Medical History  Diagnosis Date  . Hypertension   . Diverticulitis   . Seasonal allergies    Past Surgical History  Procedure Laterality Date  . Laparoscopic hysterectomy    . Cholecystectomy    . Abdominal hysterectomy     No family history on file. Social History  Substance Use Topics  . Smoking status: Never Smoker   . Smokeless tobacco: None  . Alcohol Use: Yes     Comment: social   OB History    No data available     Review of Systems  Constitutional: Negative for fever and  chills.  HENT: Negative for congestion and rhinorrhea.   Eyes: Negative for redness and visual disturbance.  Respiratory: Negative for shortness of breath and wheezing.   Cardiovascular: Negative for chest pain and palpitations.  Gastrointestinal: Positive for abdominal pain and diarrhea. Negative for nausea and vomiting.  Genitourinary: Negative for dysuria and urgency.  Musculoskeletal: Negative for myalgias and arthralgias.  Skin: Negative for pallor and wound.  Neurological: Negative for dizziness and headaches.      Allergies  Review of patient's allergies indicates no known allergies.  Home Medications   Prior to Admission medications   Medication Sig Start Date End Date Taking? Authorizing Provider  budesonide-formoterol (SYMBICORT) 80-4.5 MCG/ACT inhaler Inhale 1 puff into the lungs 2 (two) times daily as needed (for shortness of breath).   Yes Historical Provider, MD  diltiazem (CARDIZEM LA) 120 MG 24 hr tablet Take 120 mg by mouth every other day.    Yes Historical Provider, MD  fluticasone (FLONASE) 50 MCG/ACT nasal spray Place 2 sprays into both nostrils daily.   Yes Historical Provider, MD  HYDROcodone-acetaminophen (NORCO) 7.5-325 MG per tablet Take 1 tablet by mouth every 6 (six) hours as needed for moderate pain. Patient taking differently: Take 0.5-1 tablets by mouth every 6 (six) hours as needed for moderate pain.  09/24/13  Yes Hollice Espy, MD  Multiple Vitamins-Minerals (HAIR/SKIN/NAILS) TABS Take 1 tablet by mouth daily.   Yes Historical Provider, MD  naproxen sodium (ALEVE) 220 MG tablet Take 660 mg by mouth every 8 (eight) hours as  needed (for pain).    Yes Historical Provider, MD  Probiotic Product (PROBIOTIC DAILY PO) Take 1 tablet by mouth daily.   Yes Historical Provider, MD  ciprofloxacin (CIPRO) 500 MG tablet Take 1 tablet (500 mg total) by mouth 2 (two) times daily. One po bid x 7 days 03/17/15   Arlin Sass, DO  metroNIDAZOLE (FLAGYL) 500 MG tablet Take  1 tablet (500 mg total) by mouth 2 (two) times daily. One po bid x 7 days 03/17/15   Camryn Lampson, DO   BP 132/93 mmHg  Pulse 89  Temp(Src) 97.8 F (36.6 C) (Oral)  Resp 18  Ht 5\' 2"  (1.575 m)  Wt 218 lb (98.884 kg)  BMI 39.86 kg/m2  SpO2 99% Physical Exam  Constitutional: She is oriented to person, place, and time. She appears well-developed and well-nourished. No distress.  HENT:  Head: Normocephalic and atraumatic.  Eyes: EOM are normal. Pupils are equal, round, and reactive to light.  Neck: Normal range of motion. Neck supple.  Cardiovascular: Normal rate and regular rhythm.  Exam reveals no gallop and no friction rub.   No murmur heard. Pulmonary/Chest: Effort normal. She has no wheezes. She has no rales.  Abdominal: Soft. She exhibits no distension. There is tenderness (diffuse worst in the right lower quadrant, negative Rosvigs negative psoas positive obturator). There is no rebound and no guarding.  Musculoskeletal: She exhibits no edema or tenderness.  Neurological: She is alert and oriented to person, place, and time.  Skin: Skin is warm and dry. She is not diaphoretic.  Psychiatric: She has a normal mood and affect. Her behavior is normal.  Nursing note and vitals reviewed.   ED Course  Procedures (including critical care time) Labs Review Labs Reviewed  COMPREHENSIVE METABOLIC PANEL - Abnormal; Notable for the following:    ALT 12 (*)    All other components within normal limits  CBC - Abnormal; Notable for the following:    RBC 5.23 (*)    All other components within normal limits  URINALYSIS, ROUTINE W REFLEX MICROSCOPIC (NOT AT [facility]) - Abnormal; Notable for the following:    APPearance HAZY (*)    All other components within normal limits  I-STAT CHEM 8, ED - Abnormal; Notable for the following:    Hemoglobin 15.6 (*)    All other components within normal limits  LIPASE, BLOOD    Imaging Review Ct Abdomen Pelvis W Contrast  03/17/2015  CLINICAL DATA:   Patient c/o diverticulitis has flared up, sent by [facility] for evaluation. Symptoms, emesis and abd pain yesterday, resolved, and yesterday began having more nausea, and diarrhea. EXAM: CT ABDOMEN AND PELVIS WITH CONTRAST TECHNIQUE: Multidetector CT imaging of the abdomen and pelvis was performed using the standard protocol following bolus administration of intravenous contrast. CONTRAST:  [id number] FountLake Butler Hospital Hand Surgery CenterityAQUE IOHEXOL 300 MG/ML [id number] FountTresanti Surgical Center LLCin59m34LL43oTrish FountPOlean General Hospitalityaciela<MEASUREME<MEASURE<MEAS34m33RSt. P87eTrish FouThunder Road Chemical Dependency Recovery HospitaldenuLuiz Graciela<MEASUREME43m1181STrish FountSMunson Healthcare Charlevoix HospitaltonrLuiz Graciela<MEASUREMENSoi<784erLuiz Graciela<MEASUREMENSoi<784erLuiz Graciela<MEASUREME50m72S73WTrish FouCarilion Roanoke Community HospitalnerrLuiz Graciela<MEASUREME63m74S29RTrish FountCentegra Health System - Woodstock Ho60m3361pTrish FounNew Britain Surgery Cent18m32rWes57tTrish FounNiobrara Health And Life 80m126Trish FountEasMarietta Advanced Surgery 70m84e34MTrish FounNorthern Arizona Surgicenter LLClowrLuiz Graciela<MEASUREME28m70SR19iTrish FountSuCarrus Rehabilitation HospitalSetrLuiz Graciela<MEASUREMENSoi<784erLuiz Graciela<MEASUREMENSoi<784erLuiz Graciela<MEASUREME5080mTrish FountMesa delNorth Ottawa Community HospitalllorLuiz Graciela<MEASUREMENSoi<784erLuiz Graciela<MEASUREMENSoi<784erelyn ScotttnaHEXOL 300 MG/ML SOLN COMPARISON:  09/17/2013 FINDINGS: Lower chest: Heart size is normal. No imaged pericardial effusion or significant coronary artery calcifications. No pulmonary nodules, pleural effusions, or infiltrates. Upper abdomen: Status post cholecystectomy. Mild intrahepatic biliary duct dilatation. No focal abnormality identified within the liver, spleen, pancreas, or adrenal glands. Cysts identified in the midpole of the left kidney. There is no hydronephrosis. Right kidney is normal in appearance. Gastrointestinal tract: Stomach and small bowel loops are normal in appearance. The appendix is well seen and has a normal appearance. Numerous colonic diverticular present. There is stranding surrounding the colon from the mid descending portion through the proximal sigmoid colon  consistent with acute diverticulitis. There is no evidence for perforation or abscess. No obstruction. The wall of the colon is quite thick distal to the most involved segment, measuring up to 1.6 cm. Pelvis: The uterus is absent. No adnexal mass. No free pelvic fluid. Retroperitoneum: No evidence for aortic aneurysm. No retroperitoneal or mesenteric adenopathy. Abdominal wall: Visualized portion is unremarkable. Osseous structures: There is moderate degenerative change within the lower thoracic spine. There is 3 mm anterolisthesis L4 on L5. Facet hypertrophy identified at L4-5 and L5-S1. No suspicious lytic or blastic lesions are identified. IMPRESSION: 1. Acute diverticulitis involving the  mid descending colon and proximal sigmoid colon. 2. Given the thickness of the sigmoid colon, consider follow-up colonoscopy following clinical improvement to exclude malignancy. 3. No associated perforation or abscess. 4. Status post cholecystectomy. 5. Normal appendix. 6. Left renal cyst. 7. Degenerative disc disease. Electronically Signed   By: Norva Pavlov M.D.   On: 03/17/2015 13:08   I have personally reviewed and evaluated these images and lab results as part of my medical decision-making.   EKG Interpretation None      MDM   Final diagnoses:  Acute diverticulitis    59 yo F with a chief complaint of lower abdominal pain. Tenderness worse at McBurney's point. We'll obtain a CT scan with contrast rule out appendicitis, diverticulitis.   CT scan with diverticulitis. Patient is well-appearing and nontoxic. Able take by mouth without difficulty. Will trial outpatient therapy. Patient concerned because she failed this previously. States that her GI doctor was concerned and wanted the IV antibiotics. We'll give her initial IV dosing discussed with Dr. Adela Lank, agrees with outpatient therapy.   2:47 PM:  I have discussed the diagnosis/risks/treatment options with the patient and believe the pt to be eligible for discharge home to follow-up with PCP. We also discussed returning to the ED immediately if new or worsening sx occur. We discussed the sx which are most concerning (e.g., sudden worsening pain, fever, inability to tolerate by mouth) that necessitate immediate return. Medications administered to the patient during their visit and any new prescriptions provided to the patient are listed below.  Medications given during this visit Medications  ciprofloxacin (CIPRO) IVPB 400 mg (400 mg Intravenous New Bag/Given 03/17/15 1442)  metroNIDAZOLE (FLAGYL) IVPB 500 mg (not administered)  ondansetron (ZOFRAN) injection 4 mg (4 mg Intravenous Given 03/17/15 1100)  morphine 4 MG/ML injection  4 mg (4 mg Intravenous Given 03/17/15 1100)  iohexol (OMNIPAQUE) 300 MG/ML solution 50 mL (50 mLs Oral Contrast Given 03/17/15 1115)  iohexol (OMNIPAQUE) 300 MG/ML solution 100 mL (100 mLs Intravenous Contrast Given 03/17/15 1233)    New Prescriptions   CIPROFLOXACIN (CIPRO) 500 MG TABLET    Take 1 tablet (500 mg total) by mouth 2 (two) times daily. One po bid x 7 days   METRONIDAZOLE (FLAGYL) 500 MG TABLET    Take 1 tablet (500 mg total) by mouth 2 (two) times daily. One po bid x 7 days    The patient appears reasonably screen and/or stabilized for discharge and I doubt any other medical condition or other [facility] requiring further screening, evaluation, or treatment in the ED at this time prior to discharge.      Melene Plan, DO 03/17/15 1447

## 2015-03-17 NOTE — ED Notes (Signed)
Patient c/o diverticulitis has flared up, [facility] Symptoms, emesis and abd pain yesterday, resolved, and yesterday began having more nausea, and diarrhea.

## 2015-03-17 NOTE — Discharge Instructions (Signed)
Diverticulitis  Diverticulitis is when small pockets that have formed in your colon (large intestine) become infected or swollen.  HOME CARE  · Follow your doctor's instructions.  · Follow a special diet if told by your doctor.  · When you feel better, your doctor may tell you to change your diet. You may be told to eat a lot of fiber. Fruits and vegetables are good sources of fiber. Fiber makes it easier to poop (have bowel movements).  · Take supplements or probiotics as told by your doctor.  · Only take medicines as told by your doctor.  · Keep all follow-up visits with your doctor.  GET HELP IF:  · Your pain does not get better.  · You have a hard time eating food.  · You are not pooping like normal.  GET HELP RIGHT AWAY IF:  · Your pain gets worse.  · Your problems do not get better.  · Your problems suddenly get worse.  · You have a fever.  · You keep throwing up (vomiting).  · You have bloody or black, tarry poop (stool).  MAKE SURE YOU:   · Understand these instructions.  · Will watch your condition.  · Will get help right away if you are not doing well or get worse.     This information is not intended to replace advice given to you by your health care provider. Make sure you discuss any questions you have with your health care provider.     Document Released: 10/12/2007 Document Revised: 04/30/2013 Document Reviewed: 03/20/2013  Elsevier Interactive Patient Education ©2016 [employer]

## 2018-10-12 ENCOUNTER — Encounter (HOSPITAL_BASED_OUTPATIENT_CLINIC_OR_DEPARTMENT_OTHER): Payer: Self-pay | Admitting: Emergency Medicine

## 2018-10-12 ENCOUNTER — Emergency Department (HOSPITAL_BASED_OUTPATIENT_CLINIC_OR_DEPARTMENT_OTHER): Payer: BC Managed Care – PPO

## 2018-10-12 ENCOUNTER — Emergency Department (HOSPITAL_BASED_OUTPATIENT_CLINIC_OR_DEPARTMENT_OTHER)
Admission: EM | Admit: 2018-10-12 | Discharge: 2018-10-12 | Disposition: A | Discharge status: Home / Self Care | Payer: BC Managed Care – PPO | Attending: Emergency Medicine | Admitting: Emergency Medicine

## 2018-10-12 ENCOUNTER — Other Ambulatory Visit: Payer: Self-pay

## 2018-10-12 DIAGNOSIS — Z9049 Acquired absence of other specified parts of digestive tract: Secondary | ICD-10-CM | POA: Insufficient documentation

## 2018-10-12 DIAGNOSIS — I10 Essential (primary) hypertension: Secondary | ICD-10-CM | POA: Diagnosis not present

## 2018-10-12 DIAGNOSIS — K5732 Diverticulitis of large intestine without perforation or abscess without bleeding: Secondary | ICD-10-CM

## 2018-10-12 DIAGNOSIS — R1012 Left upper quadrant pain: Secondary | ICD-10-CM | POA: Diagnosis present

## 2018-10-12 LAB — URINALYSIS, ROUTINE W REFLEX MICROSCOPIC
Bilirubin Urine: NEGATIVE
Glucose, UA: NEGATIVE mg/dL
Hgb urine dipstick: NEGATIVE
Ketones, ur: NEGATIVE mg/dL
Nitrite: NEGATIVE
Protein, ur: NEGATIVE mg/dL
Specific Gravity, Urine: 1.015 (ref 1.005–1.030)
pH: 8.5 — ABNORMAL HIGH (ref 5.0–8.0)

## 2018-10-12 LAB — COMPREHENSIVE METABOLIC PANEL
ALT: 16 U/L (ref 0–44)
AST: 20 U/L (ref 15–41)
Albumin: 4.6 g/dL (ref 3.5–5.0)
Alkaline Phosphatase: 64 U/L (ref 38–126)
Anion gap: 9 (ref 5–15)
BUN: 13 mg/dL (ref 8–23)
CO2: 24 mmol/L (ref 22–32)
Calcium: 9.5 mg/dL (ref 8.9–10.3)
Chloride: 108 mmol/L (ref 98–111)
Creatinine, Ser: 0.77 mg/dL (ref 0.44–1.00)
GFR calc Af Amer: >60 mL/min (ref >60)
GFR calc non Af Amer: >60 mL/min (ref >60)
Glucose, Bld: 112 mg/dL — ABNORMAL HIGH (ref 70–99)
Potassium: 3.6 mmol/L (ref 3.5–5.1)
Sodium: 141 mmol/L (ref 135–145)
Total Bilirubin: 1.3 mg/dL — ABNORMAL HIGH (ref 0.3–1.2)
Total Protein: 8 g/dL (ref 6.5–8.1)

## 2018-10-12 LAB — CBC WITH DIFFERENTIAL/PLATELET
Abs Immature Granulocytes: 0.01 10*3/uL (ref 0.00–0.07)
Basophils Absolute: 0 10*3/uL (ref 0.0–0.1)
Basophils Relative: 0 %
Eosinophils Absolute: 0 10*3/uL (ref 0.0–0.5)
Eosinophils Relative: 0 %
HCT: 43.8 % (ref 36.0–46.0)
Hemoglobin: 13.8 g/dL (ref 12.0–15.0)
Immature Granulocytes: 0 %
Lymphocytes Relative: 17 %
Lymphs Abs: 1.2 10*3/uL (ref 0.7–4.0)
MCH: 27.8 pg (ref 26.0–34.0)
MCHC: 31.5 g/dL (ref 30.0–36.0)
MCV: 88.3 fL (ref 80.0–100.0)
Monocytes Absolute: 0.4 10*3/uL (ref 0.1–1.0)
Monocytes Relative: 5 %
Neutro Abs: 5.3 10*3/uL (ref 1.7–7.7)
Neutrophils Relative %: 78 %
Platelets: 286 10*3/uL (ref 150–400)
RBC: 4.96 MIL/uL (ref 3.87–5.11)
RDW: 14.4 % (ref 11.5–15.5)
WBC: 6.9 10*3/uL (ref 4.0–10.5)
nRBC: 0 % (ref 0.0–0.2)

## 2018-10-12 LAB — URINALYSIS, MICROSCOPIC (REFLEX): RBC / HPF: NONE SEEN RBC/hpf (ref 0–5)

## 2018-10-12 LAB — LIPASE, BLOOD: Lipase: 20 U/L (ref 11–51)

## 2018-10-12 MED ORDER — METRONIDAZOLE 500 MG PO TABS
500.0000 mg | ORAL_TABLET | Freq: Once | ORAL | Status: AC
  Administered 2018-10-12: 500 mg via ORAL
  Filled 2018-10-12: qty 1

## 2018-10-12 MED ORDER — MOXIFLOXACIN HCL 400 MG PO TABS: 400.0000 mg | ORAL_TABLET | Freq: Every day | ORAL | 0 refills | Status: AC

## 2018-10-12 MED ORDER — PROMETHAZINE HCL 25 MG PO TABS: 25.0000 mg | ORAL_TABLET | Freq: Four times a day (QID) | ORAL | 1 refills | Status: AC | PRN

## 2018-10-12 MED ORDER — CEFTRIAXONE SODIUM 1 G IJ SOLR
1.0000 g | Freq: Once | INTRAMUSCULAR | Status: AC
  Administered 2018-10-12: 03:00:00 1 g via INTRAVENOUS
  Filled 2018-10-12: qty 10

## 2018-10-12 MED ORDER — HYDROCODONE-ACETAMINOPHEN 5-325 MG PO TABS: 1.0000 | ORAL_TABLET | Freq: Four times a day (QID) | ORAL | 0 refills | Status: AC | PRN

## 2018-10-12 MED ORDER — ONDANSETRON HCL 4 MG/2ML IJ SOLN
4.0000 mg | Freq: Once | INTRAMUSCULAR | Status: AC
  Administered 2018-10-12: 4 mg via INTRAVENOUS

## 2018-10-12 MED ORDER — FENTANYL CITRATE (PF) 100 MCG/2ML IJ SOLN
50.0000 ug | INTRAMUSCULAR | Status: DC | PRN
  Administered 2018-10-12 (×2): 50 ug via INTRAVENOUS
  Filled 2018-10-12: qty 2

## 2018-10-12 MED ORDER — IOHEXOL 300 MG/ML  SOLN
100.0000 mL | Freq: Once | INTRAMUSCULAR | Status: AC | PRN
  Administered 2018-10-12: 100 mL via INTRAVENOUS

## 2018-10-12 MED ORDER — FENTANYL CITRATE (PF) 100 MCG/2ML IJ SOLN
INTRAMUSCULAR | Status: AC
  Administered 2018-10-12: 50 ug via INTRAVENOUS
  Filled 2018-10-12: qty 2

## 2018-10-12 MED ORDER — ONDANSETRON HCL 4 MG/2ML IJ SOLN
INTRAMUSCULAR | Status: AC
  Filled 2018-10-12: qty 2

## 2018-10-12 NOTE — ED Notes (Signed)
Pt awaiting ride home.

## 2018-10-12 NOTE — ED Provider Notes (Signed)
[facility] Provider Note: Lowella Dell, MD, FACEP  CSN: [id number] MRN: [id number] ARRIVAL: 10/12/18 at 0019 ROOM: [facility]09/[facility]09   CHIEF COMPLAINT  Abdominal Pain   HISTORY OF PRESENT ILLNESS  10/12/18 12:50 AM Margaret Rush is a 63 y.o. female with abdominal pain since yesterday evening about 8:30 PM.  The pain is located primarily in the left upper quadrant radiating around to her left flank but she has diffuse tenderness as well.  She describes the pain as sharp and stabbing as well as having a cramping component.  She rates her pain as an 8 out of 10.  Pain is worse with movement or palpation.  She has had associated nausea, vomiting and loose stools but not frank diarrhea.  She has a history of diverticulitis but is not sure if this is the same pain.   Past Medical History:  Diagnosis Date   Diverticulitis    Hypertension    Seasonal allergies     Past Surgical History:  Procedure Laterality Date   ABDOMINAL HYSTERECTOMY     CHOLECYSTECTOMY      No family history on file.  Social History   Tobacco Use   Smoking status: Never Smoker   Smokeless tobacco: Never Used  Substance Use Topics   Alcohol use: Yes    Comment: social   Drug use: Never    Prior to Admission medications   Medication Sig Start Date End Date Taking? Authorizing Provider  budesonide-formoterol (SYMBICORT) 80-4.5 MCG/ACT inhaler Inhale 1 puff into the lungs 2 (two) times daily as needed (for shortness of breath).    [provider]  fluticasone (FLONASE) 50 MCG/ACT nasal spray Place 2 sprays into both nostrils daily.    [provider]  HYDROcodone-acetaminophen (NORCO) 5-325 MG tablet Take 1 tablet by mouth every 6 (six) hours as needed (for pain; may cause constipation). 10/12/18   Delvis Kau, Jonny Ruiz, MD  moxifloxacin (AVELOX) 400 MG tablet Take 1 tablet (400 mg total) by mouth daily at 8 pm. 10/12/18   Jay Haskew, MD  Multiple Vitamins-Minerals (HAIR/SKIN/NAILS)  TABS Take 1 tablet by mouth daily.    [provider]  Probiotic Product (PROBIOTIC DAILY PO) Take 1 tablet by mouth daily.    [provider]  promethazine (PHENERGAN) 25 MG tablet Take 1 tablet (25 mg total) by mouth every 6 (six) hours as needed for nausea or vomiting. 10/12/18   Guage Efferson, MD    Allergies Amoxicillin-pot clavulanate   REVIEW OF SYSTEMS  Negative except as noted here or in the History of Present Illness.   PHYSICAL EXAMINATION  Initial Vital Signs Blood pressure (!) 145/90, pulse 89, temperature 98.4 F (36.9 C), temperature source Oral, height  (1.575 m), weight 95.3 kg, SpO2 97 %.  Examination General: Well-developed, well-nourished female in no acute distress; appearance consistent with age of record HENT: normocephalic; atraumatic Eyes: pupils equal, round and reactive to light; extraocular muscles intact Neck: supple Heart: regular rate and rhythm Lungs: clear to auscultation bilaterally Abdomen: soft; nondistended; diffuse tenderness with severe left upper quadrant tenderness; bowel sounds present Extremities: No deformity; full range of motion; pulses normal Neurologic: Awake, alert and oriented; motor function intact in all extremities and symmetric; no facial droop Skin: Warm and dry Psychiatric: Normal mood and affect   RESULTS  Summary of this visit's results, reviewed by myself:   EKG Interpretation  Date/Time:    Ventricular Rate:    PR Interval:    QRS Duration:   QT  Interval:    QTC Calculation:   R Axis:     Text Interpretation:        Laboratory Studies: Results for orders placed or performed during the hospital encounter of 10/12/18 (from the past 24 hour(s))  Urinalysis, Routine w reflex microscopic     Status: Abnormal   Collection Time: 10/12/18 12:48 AM  Result Value Ref Range   Color, Urine YELLOW YELLOW   APPearance CLEAR CLEAR   Specific Gravity, Urine 1.015 1.005 - 1.030   pH 8.5 (H) 5.0 -  8.0   Glucose, UA NEGATIVE NEGATIVE mg/dL   Hgb urine dipstick NEGATIVE NEGATIVE   Bilirubin Urine NEGATIVE NEGATIVE   Ketones, ur NEGATIVE NEGATIVE mg/dL   Protein, ur NEGATIVE NEGATIVE mg/dL   Nitrite NEGATIVE NEGATIVE   Leukocytes,Ua TRACE (A) NEGATIVE  Lipase, blood     Status: None   Collection Time: 10/12/18 12:48 AM  Result Value Ref Range   Lipase 20 11 - 51 U/L  Comprehensive metabolic panel     Status: Abnormal   Collection Time: 10/12/18 12:48 AM  Result Value Ref Range   Sodium 141 135 - 145 mmol/L   Potassium 3.6 3.5 - 5.1 mmol/L   Chloride 108 98 - 111 mmol/L   CO2 24 22 - 32 mmol/L   Glucose, Bld 112 (H) 70 - 99 mg/dL   BUN 13 8 - 23 mg/dL   Creatinine, Ser 2.130.77 0.44 - 1.00 mg/dL   Calcium 9.5 8.9 - 08.610.3 mg/dL   Total Protein 8.0 6.5 - 8.1 g/dL   Albumin 4.6 3.5 - 5.0 g/dL   AST 20 15 - 41 U/L   ALT 16 0 - 44 U/L   Alkaline Phosphatase 64 38 - 126 U/L   Total Bilirubin 1.3 (H) 0.3 - 1.2 mg/dL   GFR calc non Af Amer >60 >60 mL/min   GFR calc Af Amer >60 >60 mL/min   Anion gap 9 5 - 15  CBC with Differential     Status: None   Collection Time: 10/12/18 12:48 AM  Result Value Ref Range   WBC 6.9 4.0 - 10.5 K/uL   RBC 4.96 3.87 - 5.11 MIL/uL   Hemoglobin 13.8 12.0 - 15.0 g/dL   HCT 57.843.8 46.936.0 - 62.946.0 %   MCV 88.3 80.0 - 100.0 fL   MCH 27.8 26.0 - 34.0 pg   MCHC 31.5 30.0 - 36.0 g/dL   RDW 52.814.4 41.311.5 - 24.415.5 %   Platelets 286 150 - 400 K/uL   nRBC 0.0 0.0 - 0.2 %   Neutrophils Relative % 78 %   Neutro Abs 5.3 1.7 - 7.7 K/uL   Lymphocytes Relative 17 %   Lymphs Abs 1.2 0.7 - 4.0 K/uL   Monocytes Relative 5 %   Monocytes Absolute 0.4 0.1 - 1.0 K/uL   Eosinophils Relative 0 %   Eosinophils Absolute 0.0 0.0 - 0.5 K/uL   Basophils Relative 0 %   Basophils Absolute 0.0 0.0 - 0.1 K/uL   Immature Granulocytes 0 %   Abs Immature Granulocytes 0.01 0.00 - 0.07 K/uL  Urinalysis, Microscopic (reflex)     Status: Abnormal   Collection Time: 10/12/18 12:48 AM  Result  Value Ref Range   RBC / HPF NONE SEEN 0 - 5 RBC/hpf   WBC, UA 0-5 0 - 5 WBC/hpf   Bacteria, UA FEW (A) NONE SEEN   Squamous Epithelial / LPF 0-5 0 - 5   Imaging Studies: Ct Abdomen Pelvis W  Contrast  Result Date: 10/12/2018 CLINICAL DATA:  63 year old female with left upper quadrant abdominal pain. EXAM: CT ABDOMEN AND PELVIS WITH CONTRAST TECHNIQUE: Multidetector CT imaging of the abdomen and pelvis was performed using the standard protocol following bolus administration of intravenous contrast. CONTRAST:  OMNIPAQUE IOHEXOL 300 MG/ML  SOLN COMPARISON:  CT of the abdomen pelvis dated 05/07/2017 FINDINGS: Lower chest: The visualized lung bases are clear. No intra-abdominal free air or free fluid. Hepatobiliary: Probable mild fatty infiltration of the liver. Cholecystectomy. Pancreas: Unremarkable. No pancreatic ductal dilatation or surrounding inflammatory changes. Spleen: Normal in size without focal abnormality. Adrenals/Urinary Tract: The adrenal glands are unremarkable. There is no hydronephrosis on either side. Subcentimeter left hepatic hypodense lesion is too small to characterize but likely represents a cyst. There is symmetric enhancement and excretion of contrast by both kidneys. The visualized ureters and urinary bladder appear unremarkable. Stomach/Bowel: There is diverticulosis of the descending and sigmoid colon. There is mild inflammatory changes of the mid to lower descending colon consistent with acute diverticulitis. There is thickening of the distal descending/sigmoid colon most consistent with muscular hypertrophy. There is associated degree of luminal narrowing in this segment of the:. No small bowel obstruction. The appendix is not visualized with certainty. No inflammatory changes identified in the right lower quadrant. Vascular/Lymphatic: The abdominal aorta and IVC appear unremarkable. No portal venous gas. There is no adenopathy. Reproductive: Hysterectomy. No pelvic mass.  Other: None Musculoskeletal: Degenerative changes of the spine including disc desiccation and vacuum phenomena at L5-S1 and multilevel facet arthropathy. No acute osseous pathology. IMPRESSION: Diverticulitis of the descending colon. No abscess or perforation. Electronically Signed   By: Elgie Collard M.D.   On: 10/12/2018 01:45    ED COURSE and MDM  Nursing notes and initial vitals signs, including pulse oximetry, reviewed.  Vitals:   10/12/18 0028 10/12/18 0029 10/12/18 0030 10/12/18 0350  BP:  (!) 145/90  136/76  Pulse:  90 89 83  Resp:    16  Temp:   98.4 F (36.9 C)   TempSrc:   Oral   SpO2:  97% 97% 100%  Weight: 95.3 kg     Height: 5\' 2"  (1.575 m)      Patient given IV Rocephin and oral Flagyl in the ED.  Due to multiple antibiotic intolerances we will send her home on moxifloxacin monotherapy.  She was advised to return if symptoms are worsening.  PROCEDURES    ED DIAGNOSES     ICD-10-CM   1. Diverticulitis of large intestine without perforation or abscess without bleeding K57.32        Samael Blades, MD 10/12/18 0630

## 2018-10-12 NOTE — ED Triage Notes (Signed)
abd pain started at 2030 yesterday. Vomiting today. No diarrhea.

## 2021-05-24 IMAGING — CT CT ABDOMEN AND PELVIS WITH CONTRAST
2 of 5 series · 16 of 46 positions shown, 18 images · IV contrast (APPLIED)
Comparison: CT of the abdomen pelvis dated 05/07/2017

CLINICAL DATA: 63-year-old female with left upper quadrant
abdominal pain.

EXAM:
CT ABDOMEN AND PELVIS WITH CONTRAST
TECHNIQUE: Multidetector CT imaging of the abdomen and pelvis was performed
using the standard protocol following bolus administration of
intravenous contrast.
CONTRAST:  100mL OMNIPAQUE IOHEXOL 300 MG/ML  SOLN

[Series 2: axial st · axial · 0.72mm/px · z∈[-167,+223]mm · 13 of 88 slices shown, 15 images]
[im 5/88  soft-tissue]
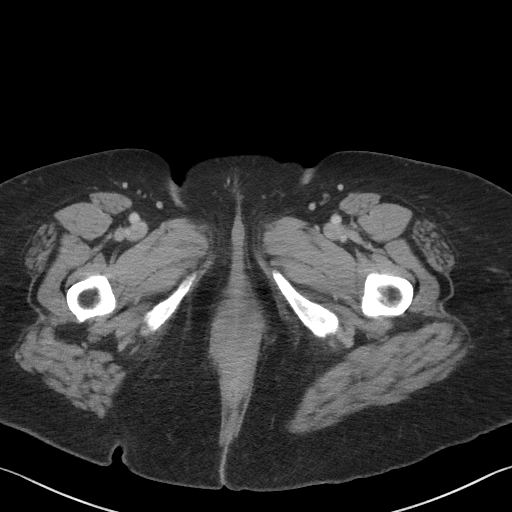
[im 5/88  bone]
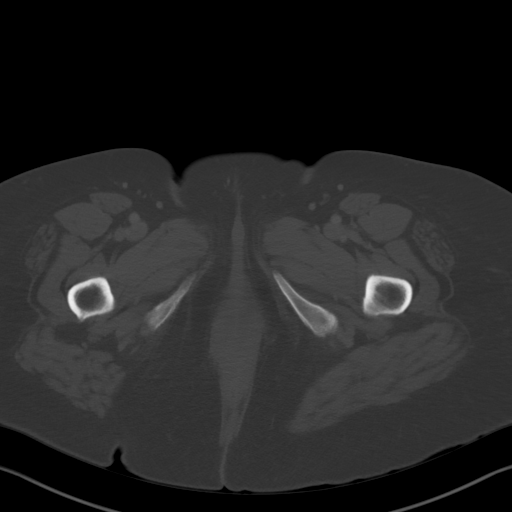
[im 14/88  soft-tissue]
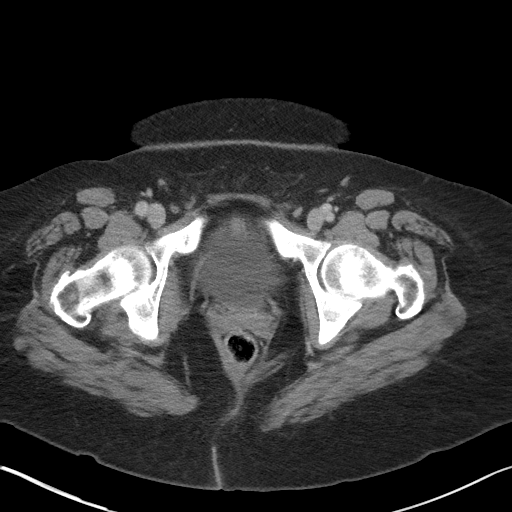
[im 19/88  soft-tissue]
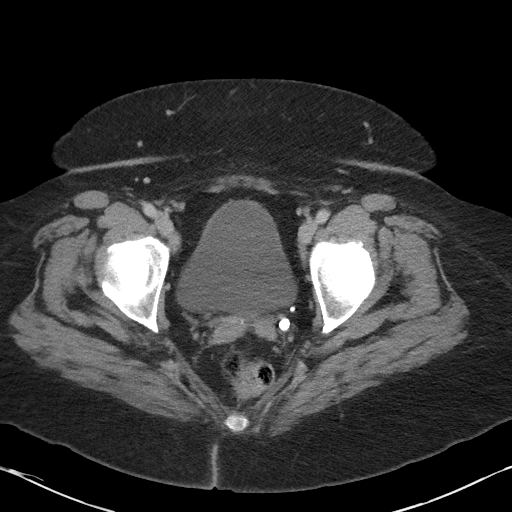
[im 23/88  soft-tissue]
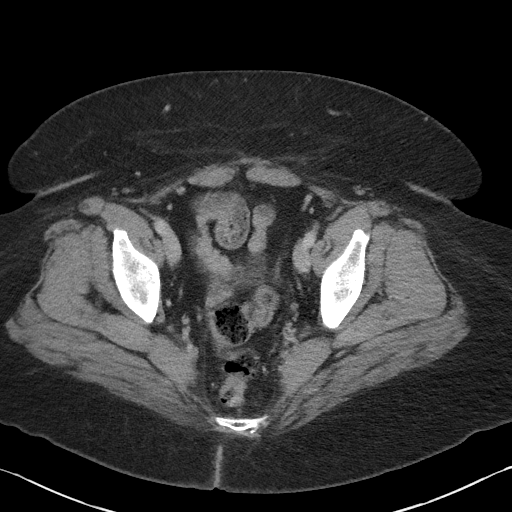
[im 33/88  soft-tissue]
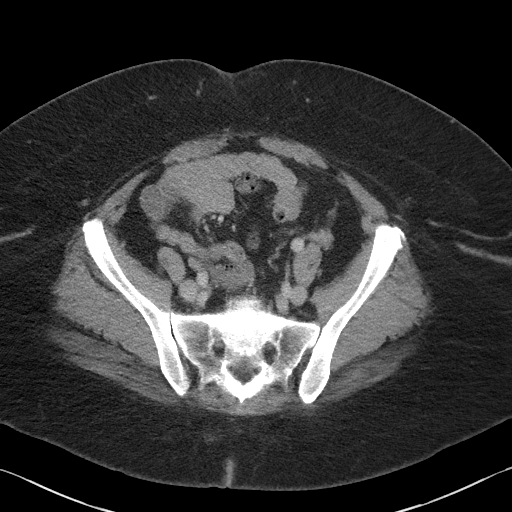
[im 37/88  soft-tissue]
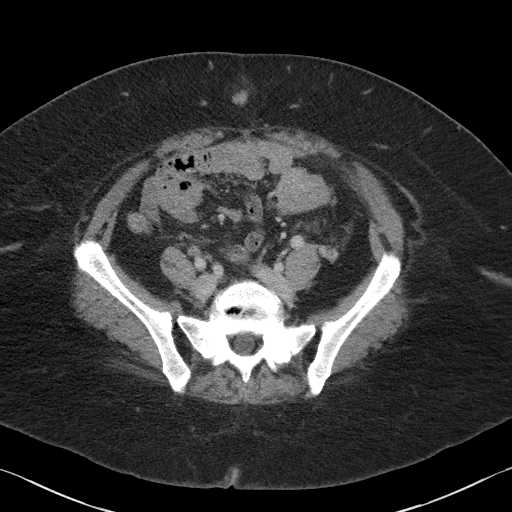
[im 46/88  soft-tissue]
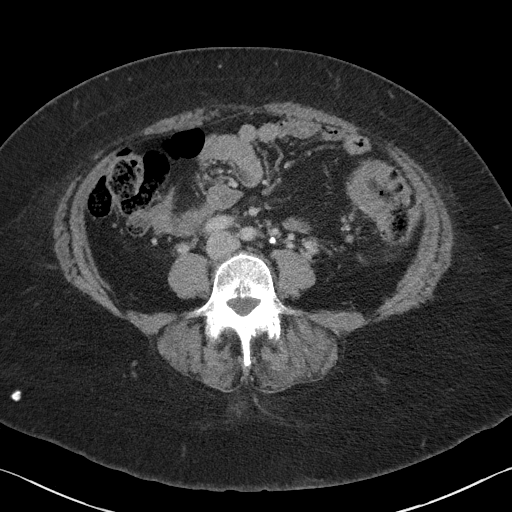
[im 51/88  soft-tissue]
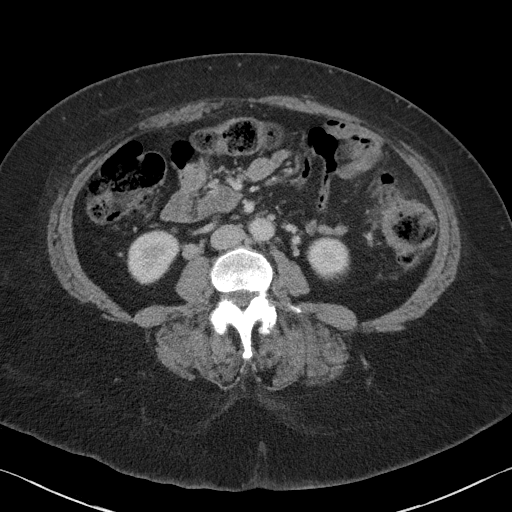
[im 55/88  soft-tissue]
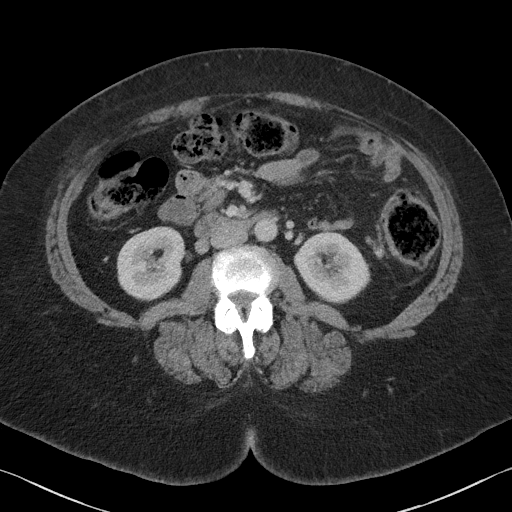
[im 55/88  bone]
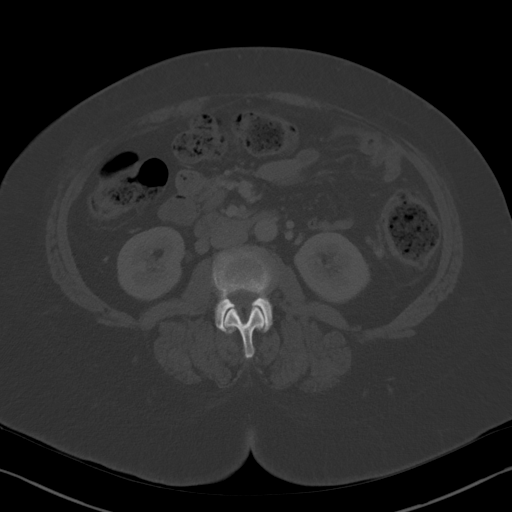
[im 65/88  soft-tissue]
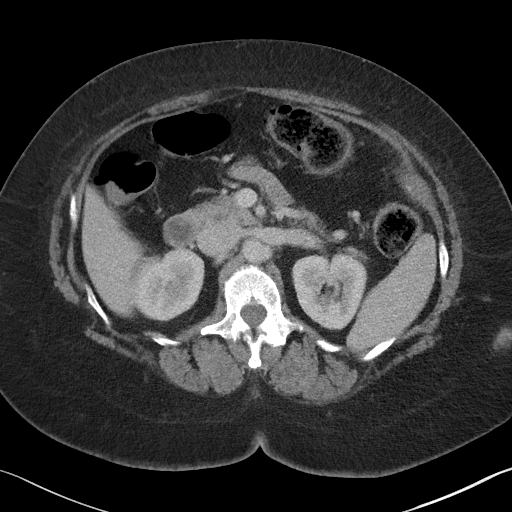
[im 69/88  soft-tissue]
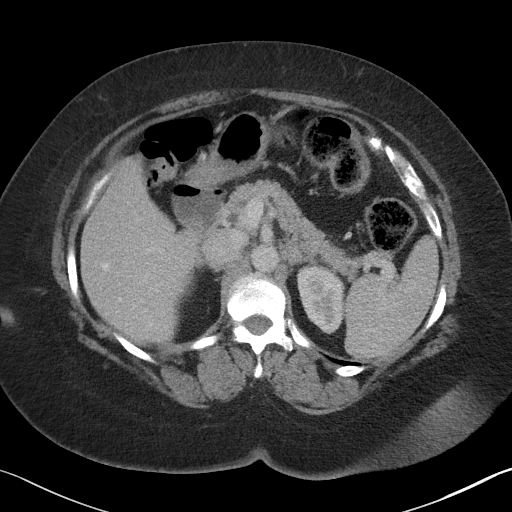
[im 74/88  soft-tissue]
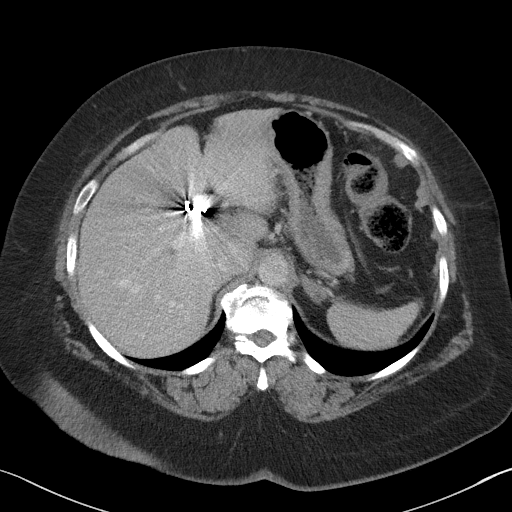
[im 83/88  soft-tissue]
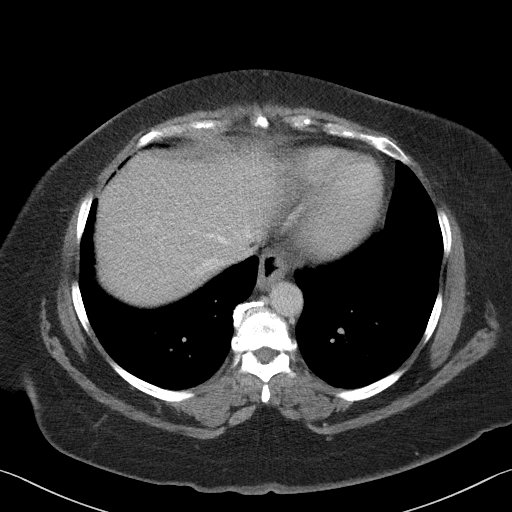

[Series 5: coronal st · coronal · 0.69mm/px · 3 of 101 slices shown]
[im 34/101  soft-tissue]
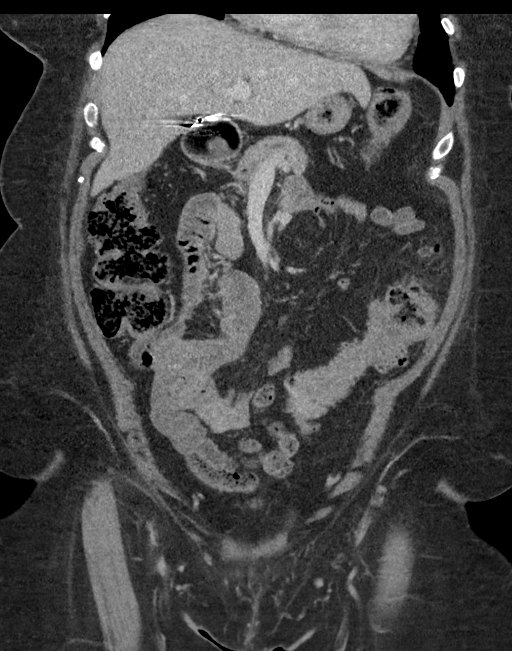
[im 45/101  soft-tissue]
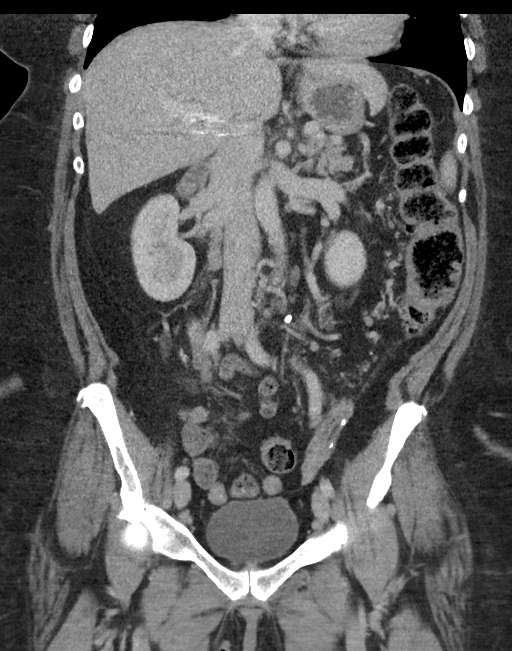
[im 56/101  soft-tissue]
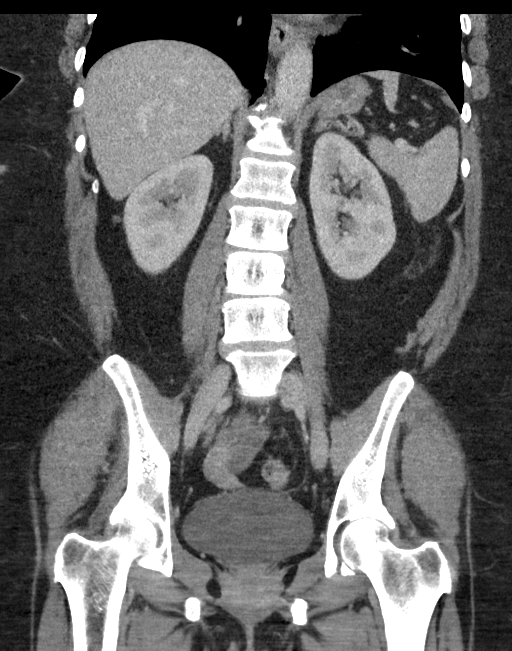

[16 of 46 positions shown; findings below may reference images not displayed]

FINDINGS: Lower chest: The visualized lung bases are clear.

No intra-abdominal free air or free fluid.

Hepatobiliary: Probable mild fatty infiltration of the liver.
Cholecystectomy.

Pancreas: Unremarkable. No pancreatic ductal dilatation or
surrounding inflammatory changes.

Spleen: Normal in size without focal abnormality.

Adrenals/Urinary Tract: The adrenal glands are unremarkable. There
is no hydronephrosis on either side. Subcentimeter left hepatic
hypodense lesion is too small to characterize but likely represents
a cyst. There is symmetric enhancement and excretion of contrast by
both kidneys. The visualized ureters and urinary bladder appear
unremarkable.

Stomach/Bowel: There is diverticulosis of the descending and sigmoid
colon. There is mild inflammatory changes of the mid to lower
descending colon consistent with acute diverticulitis. There is
thickening of the distal descending/sigmoid colon most consistent
with muscular hypertrophy. There is associated degree of luminal
narrowing in this segment of the:. No small bowel obstruction. The
appendix is not visualized with certainty. No inflammatory changes
identified in the right lower quadrant.

Vascular/Lymphatic: The abdominal aorta and IVC appear unremarkable.
No portal venous gas. There is no adenopathy.

Reproductive: Hysterectomy. No pelvic mass.

Other: None

Musculoskeletal: Degenerative changes of the spine including disc
desiccation and vacuum phenomena at L5-S1 and multilevel facet
arthropathy. No acute osseous pathology.
IMPRESSION: Diverticulitis of the descending colon. No abscess or perforation.

## 2021-08-16 ENCOUNTER — Encounter: Payer: Self-pay | Admitting: Gastroenterology

## 2021-09-22 ENCOUNTER — Ambulatory Visit: Payer: BC Managed Care – PPO | Admitting: Gastroenterology

## 2021-10-11 ENCOUNTER — Ambulatory Visit: Payer: BC Managed Care – PPO | Admitting: Gastroenterology
